# Patient Record
Sex: Female | Born: 1945 | Race: White | Hispanic: No | Marital: Single | Smoking: Former smoker
Health system: Southern US, Community
[De-identification: ages and names within clinical notes are randomized; demographics above are authoritative.]

## PROBLEM LIST (undated history)

## (undated) DIAGNOSIS — J449 Chronic obstructive pulmonary disease, unspecified: Secondary | ICD-10-CM

## (undated) DIAGNOSIS — F329 Major depressive disorder, single episode, unspecified: Secondary | ICD-10-CM

## (undated) DIAGNOSIS — Z9989 Dependence on other enabling machines and devices: Secondary | ICD-10-CM

## (undated) DIAGNOSIS — I10 Essential (primary) hypertension: Secondary | ICD-10-CM

## (undated) DIAGNOSIS — M1990 Unspecified osteoarthritis, unspecified site: Secondary | ICD-10-CM

## (undated) DIAGNOSIS — F419 Anxiety disorder, unspecified: Secondary | ICD-10-CM

## (undated) DIAGNOSIS — G4733 Obstructive sleep apnea (adult) (pediatric): Secondary | ICD-10-CM

## (undated) DIAGNOSIS — F32A Depression, unspecified: Secondary | ICD-10-CM

## (undated) DIAGNOSIS — E119 Type 2 diabetes mellitus without complications: Secondary | ICD-10-CM

## (undated) DIAGNOSIS — E785 Hyperlipidemia, unspecified: Secondary | ICD-10-CM

## (undated) DIAGNOSIS — K5790 Diverticulosis of intestine, part unspecified, without perforation or abscess without bleeding: Secondary | ICD-10-CM

## (undated) HISTORY — PX: COLON SURGERY: SHX602

---

## 2006-03-28 ENCOUNTER — Encounter: Admission: RE | Admit: 2006-03-28 | Discharge: 2006-03-28 | Payer: Self-pay | Admitting: General Practice

## 2013-05-31 ENCOUNTER — Encounter (HOSPITAL_COMMUNITY): Payer: Self-pay | Admitting: Anesthesiology

## 2013-05-31 ENCOUNTER — Encounter (HOSPITAL_COMMUNITY)
Admission: AD | Disposition: A | Discharge status: Other Acute Inpatient Hospital | Payer: Self-pay | Source: Other Acute Inpatient Hospital | Attending: Pulmonary Disease

## 2013-05-31 ENCOUNTER — Inpatient Hospital Stay (HOSPITAL_COMMUNITY): Payer: Medicare Other | Admitting: Anesthesiology

## 2013-05-31 ENCOUNTER — Inpatient Hospital Stay (HOSPITAL_COMMUNITY)
Admission: AD | Admit: 2013-05-31 | Discharge: 2013-06-07 | DRG: 329 | Disposition: A | Discharge status: Other Acute Inpatient Hospital | Payer: Medicare Other | Source: Other Acute Inpatient Hospital | Attending: Pulmonary Disease | Admitting: Pulmonary Disease

## 2013-05-31 DIAGNOSIS — K5732 Diverticulitis of large intestine without perforation or abscess without bleeding: Principal | ICD-10-CM | POA: Diagnosis present

## 2013-05-31 DIAGNOSIS — K631 Perforation of intestine (nontraumatic): Secondary | ICD-10-CM | POA: Diagnosis present

## 2013-05-31 DIAGNOSIS — J189 Pneumonia, unspecified organism: Secondary | ICD-10-CM | POA: Diagnosis not present

## 2013-05-31 DIAGNOSIS — J9600 Acute respiratory failure, unspecified whether with hypoxia or hypercapnia: Secondary | ICD-10-CM | POA: Diagnosis present

## 2013-05-31 DIAGNOSIS — E876 Hypokalemia: Secondary | ICD-10-CM | POA: Diagnosis not present

## 2013-05-31 DIAGNOSIS — Z794 Long term (current) use of insulin: Secondary | ICD-10-CM

## 2013-05-31 DIAGNOSIS — K668 Other specified disorders of peritoneum: Secondary | ICD-10-CM

## 2013-05-31 DIAGNOSIS — E039 Hypothyroidism, unspecified: Secondary | ICD-10-CM | POA: Diagnosis present

## 2013-05-31 DIAGNOSIS — J0410 Acute tracheitis without obstruction: Secondary | ICD-10-CM | POA: Diagnosis not present

## 2013-05-31 DIAGNOSIS — E119 Type 2 diabetes mellitus without complications: Secondary | ICD-10-CM | POA: Diagnosis present

## 2013-05-31 DIAGNOSIS — G4733 Obstructive sleep apnea (adult) (pediatric): Secondary | ICD-10-CM | POA: Diagnosis present

## 2013-05-31 DIAGNOSIS — J4489 Other specified chronic obstructive pulmonary disease: Secondary | ICD-10-CM | POA: Diagnosis present

## 2013-05-31 DIAGNOSIS — D696 Thrombocytopenia, unspecified: Secondary | ICD-10-CM | POA: Diagnosis not present

## 2013-05-31 DIAGNOSIS — M1990 Unspecified osteoarthritis, unspecified site: Secondary | ICD-10-CM | POA: Diagnosis present

## 2013-05-31 DIAGNOSIS — F3289 Other specified depressive episodes: Secondary | ICD-10-CM | POA: Diagnosis present

## 2013-05-31 DIAGNOSIS — Z87891 Personal history of nicotine dependence: Secondary | ICD-10-CM

## 2013-05-31 DIAGNOSIS — L02219 Cutaneous abscess of trunk, unspecified: Secondary | ICD-10-CM | POA: Diagnosis not present

## 2013-05-31 DIAGNOSIS — F329 Major depressive disorder, single episode, unspecified: Secondary | ICD-10-CM | POA: Diagnosis present

## 2013-05-31 DIAGNOSIS — E785 Hyperlipidemia, unspecified: Secondary | ICD-10-CM | POA: Diagnosis present

## 2013-05-31 DIAGNOSIS — K659 Peritonitis, unspecified: Secondary | ICD-10-CM | POA: Diagnosis present

## 2013-05-31 DIAGNOSIS — K55059 Acute (reversible) ischemia of intestine, part and extent unspecified: Secondary | ICD-10-CM | POA: Diagnosis not present

## 2013-05-31 DIAGNOSIS — J449 Chronic obstructive pulmonary disease, unspecified: Secondary | ICD-10-CM | POA: Diagnosis present

## 2013-05-31 DIAGNOSIS — IMO0002 Reserved for concepts with insufficient information to code with codable children: Secondary | ICD-10-CM

## 2013-05-31 DIAGNOSIS — F411 Generalized anxiety disorder: Secondary | ICD-10-CM | POA: Diagnosis present

## 2013-05-31 DIAGNOSIS — J9601 Acute respiratory failure with hypoxia: Secondary | ICD-10-CM

## 2013-05-31 DIAGNOSIS — I10 Essential (primary) hypertension: Secondary | ICD-10-CM | POA: Diagnosis present

## 2013-05-31 HISTORY — DX: Obstructive sleep apnea (adult) (pediatric): G47.33

## 2013-05-31 HISTORY — DX: Essential (primary) hypertension: I10

## 2013-05-31 HISTORY — DX: Dependence on other enabling machines and devices: Z99.89

## 2013-05-31 HISTORY — DX: Anxiety disorder, unspecified: F41.9

## 2013-05-31 HISTORY — PX: LAPAROTOMY: SHX154

## 2013-05-31 HISTORY — DX: Unspecified osteoarthritis, unspecified site: M19.90

## 2013-05-31 HISTORY — DX: Diverticulosis of intestine, part unspecified, without perforation or abscess without bleeding: K57.90

## 2013-05-31 HISTORY — DX: Major depressive disorder, single episode, unspecified: F32.9

## 2013-05-31 HISTORY — DX: Type 2 diabetes mellitus without complications: E11.9

## 2013-05-31 HISTORY — DX: Chronic obstructive pulmonary disease, unspecified: J44.9

## 2013-05-31 HISTORY — PX: COLOSTOMY REVISION: SHX5232

## 2013-05-31 HISTORY — DX: Depression, unspecified: F32.A

## 2013-05-31 HISTORY — DX: Hyperlipidemia, unspecified: E78.5

## 2013-05-31 LAB — PROTIME-INR: INR: 0.93 (ref 0.00–1.49)

## 2013-05-31 SURGERY — LAPAROTOMY, EXPLORATORY
Anesthesia: General | Site: Abdomen | Wound class: Contaminated

## 2013-05-31 MED ORDER — LIDOCAINE HCL (CARDIAC) 20 MG/ML IV SOLN
INTRAVENOUS | Status: DC | PRN
  Administered 2013-05-31: 100 mg via INTRAVENOUS

## 2013-05-31 MED ORDER — ALBUMIN HUMAN 5 % IV SOLN
INTRAVENOUS | Status: DC | PRN
  Administered 2013-05-31: via INTRAVENOUS

## 2013-05-31 MED ORDER — PROPOFOL 10 MG/ML IV BOLUS
INTRAVENOUS | Status: DC | PRN
  Administered 2013-05-31: 150 mg via INTRAVENOUS

## 2013-05-31 MED ORDER — 0.9 % SODIUM CHLORIDE (POUR BTL) OPTIME
TOPICAL | Status: DC | PRN
  Administered 2013-05-31 – 2013-06-01 (×4): 1000 mL

## 2013-05-31 MED ORDER — FENTANYL CITRATE 0.05 MG/ML IJ SOLN
INTRAMUSCULAR | Status: DC | PRN
  Administered 2013-05-31: 100 ug via INTRAVENOUS
  Administered 2013-05-31: 150 ug via INTRAVENOUS
  Administered 2013-05-31 – 2013-06-01 (×2): 100 ug via INTRAVENOUS
  Administered 2013-06-01: 50 ug via INTRAVENOUS

## 2013-05-31 MED ORDER — MIDAZOLAM HCL 5 MG/5ML IJ SOLN
INTRAMUSCULAR | Status: DC | PRN
  Administered 2013-05-31 – 2013-06-01 (×2): 2 mg via INTRAVENOUS

## 2013-05-31 MED ORDER — SUCCINYLCHOLINE CHLORIDE 20 MG/ML IJ SOLN
INTRAMUSCULAR | Status: DC | PRN
  Administered 2013-05-31: 120 mg via INTRAVENOUS

## 2013-05-31 MED ORDER — SODIUM CHLORIDE 0.9 % IV SOLN
1.0000 g | Freq: Once | INTRAVENOUS | Status: AC
  Administered 2013-06-01: 1 g via INTRAVENOUS
  Filled 2013-05-31: qty 1

## 2013-05-31 MED ORDER — ONDANSETRON HCL 4 MG/2ML IJ SOLN
INTRAMUSCULAR | Status: DC | PRN
  Administered 2013-05-31: 4 mg via INTRAVENOUS

## 2013-05-31 MED ORDER — ROCURONIUM BROMIDE 100 MG/10ML IV SOLN
INTRAVENOUS | Status: DC | PRN
  Administered 2013-05-31: 50 mg via INTRAVENOUS
  Administered 2013-06-01: 20 mg via INTRAVENOUS
  Administered 2013-06-01: 10 mg via INTRAVENOUS

## 2013-05-31 MED ORDER — LACTATED RINGERS IV SOLN
INTRAVENOUS | Status: DC | PRN
  Administered 2013-05-31 – 2013-06-01 (×2): via INTRAVENOUS

## 2013-05-31 MED ORDER — HYDROCORTISONE NA SUCCINATE PF 1000 MG IJ SOLR
INTRAMUSCULAR | Status: DC | PRN
  Administered 2013-05-31: 100 mg via INTRAVENOUS

## 2013-05-31 SURGICAL SUPPLY — 53 items
BANDAGE GAUZE ELAST BULKY 4 IN (GAUZE/BANDAGES/DRESSINGS) ×2 IMPLANT
CANISTER SUCTION 2500CC (MISCELLANEOUS) ×4 IMPLANT
CHLORAPREP W/TINT 26ML (MISCELLANEOUS) ×2 IMPLANT
CLOTH BEACON ORANGE TIMEOUT ST (SAFETY) ×2 IMPLANT
COVER SURGICAL LIGHT HANDLE (MISCELLANEOUS) ×4 IMPLANT
DRAPE LAPAROSCOPIC ABDOMINAL (DRAPES) ×2 IMPLANT
DRAPE PROXIMA HALF (DRAPES) ×2 IMPLANT
DRAPE UTILITY 15X26 W/TAPE STR (DRAPE) ×4 IMPLANT
DRAPE WARM FLUID 44X44 (DRAPE) ×2 IMPLANT
DRSG PAD ABDOMINAL 8X10 ST (GAUZE/BANDAGES/DRESSINGS) ×1 IMPLANT
ELECT BLADE 6.5 EXT (BLADE) ×2 IMPLANT
ELECT CAUTERY BLADE 6.4 (BLADE) ×2 IMPLANT
ELECT REM PT RETURN 9FT ADLT (ELECTROSURGICAL) ×2
ELECTRODE REM PT RTRN 9FT ADLT (ELECTROSURGICAL) ×1 IMPLANT
GLOVE BIO SURGEON STRL SZ 6 (GLOVE) ×6 IMPLANT
GLOVE BIO SURGEON STRL SZ7.5 (GLOVE) ×2 IMPLANT
GLOVE BIO SURGEON STRL SZ8 (GLOVE) ×2 IMPLANT
GLOVE BIOGEL PI IND STRL 6.5 (GLOVE) ×1 IMPLANT
GLOVE BIOGEL PI IND STRL 8 (GLOVE) ×1 IMPLANT
GLOVE BIOGEL PI INDICATOR 6.5 (GLOVE) ×1
GLOVE BIOGEL PI INDICATOR 8 (GLOVE) ×1
GLOVE SURG SS PI 7.5 STRL IVOR (GLOVE) ×2 IMPLANT
GOWN PREVENTION PLUS XXLARGE (GOWN DISPOSABLE) ×2 IMPLANT
GOWN STRL NON-REIN LRG LVL3 (GOWN DISPOSABLE) ×4 IMPLANT
GOWN STRL REIN XL XLG (GOWN DISPOSABLE) ×2 IMPLANT
KIT BASIN OR (CUSTOM PROCEDURE TRAY) ×2 IMPLANT
KIT OSTOMY DRAINABLE 2.75 STR (WOUND CARE) ×2 IMPLANT
KIT ROOM TURNOVER OR (KITS) ×2 IMPLANT
NS IRRIG 1000ML POUR BTL (IV SOLUTION) ×4 IMPLANT
PACK GENERAL/GYN (CUSTOM PROCEDURE TRAY) ×2 IMPLANT
PAD ARMBOARD 7.5X6 YLW CONV (MISCELLANEOUS) ×2 IMPLANT
RELOAD PROXIMATE 75MM BLUE (ENDOMECHANICALS) ×2 IMPLANT
SPECIMEN JAR LARGE (MISCELLANEOUS) IMPLANT
SPONGE GAUZE 4X4 12PLY (GAUZE/BANDAGES/DRESSINGS) ×2 IMPLANT
SPONGE LAP 18X18 X RAY DECT (DISPOSABLE) ×6 IMPLANT
STAPLER PROXIMATE 75MM BLUE (STAPLE) ×1 IMPLANT
STAPLER VISISTAT 35W (STAPLE) ×2 IMPLANT
SUCTION POOLE TIP (SUCTIONS) ×2 IMPLANT
SUT PDS AB 1 TP1 96 (SUTURE) ×4 IMPLANT
SUT PDS II 0 TP-1 LOOPED 60 (SUTURE) ×4 IMPLANT
SUT PROLENE 2 0 CT2 30 (SUTURE) ×2 IMPLANT
SUT VIC AB 2-0 SH 18 (SUTURE) ×2 IMPLANT
SUT VIC AB 3-0 SH 18 (SUTURE) ×2 IMPLANT
SUT VIC AB 4-0 RB1 18 (SUTURE) ×4 IMPLANT
SUT VIC AB 4-0 SH 18 (SUTURE) ×1 IMPLANT
SUT VICRYL 4-0 PS2 18IN ABS (SUTURE) IMPLANT
SUT VICRYL AB 2 0 TIES (SUTURE) ×2 IMPLANT
SUT VICRYL AB 3 0 TIES (SUTURE) ×2 IMPLANT
TAPE CLOTH SURG 6X10 WHT LF (GAUZE/BANDAGES/DRESSINGS) ×2 IMPLANT
TOWEL OR 17X24 6PK STRL BLUE (TOWEL DISPOSABLE) ×4 IMPLANT
TOWEL OR 17X26 10 PK STRL BLUE (TOWEL DISPOSABLE) ×2 IMPLANT
TRAY FOLEY CATH 14FRSI W/METER (CATHETERS) ×2 IMPLANT
YANKAUER SUCT BULB TIP NO VENT (SUCTIONS) ×2 IMPLANT

## 2013-05-31 NOTE — Anesthesia Preprocedure Evaluation (Addendum)
Anesthesia Evaluation  Patient identified by MRN, date of birth, ID band Patient awake    Reviewed: Allergy & Precautions, H&P , NPO status , Patient's Chart, lab work & pertinent test results  History of Anesthesia Complications Negative for: history of anesthetic complications  Airway Mallampati: II  Neck ROM: Full  Mouth opening: Limited Mouth Opening  Dental  (+) Dental Advisory Given and Teeth Intact   Pulmonary COPD COPD inhaler,    + wheezing      Cardiovascular hypertension,     Neuro/Psych PSYCHIATRIC DISORDERS Anxiety negative neurological ROS     GI/Hepatic Neg liver ROS,   Endo/Other  diabetes, Insulin DependentMorbid obesity  Renal/GU negative Renal ROS     Musculoskeletal   Abdominal   Peds  Hematology negative hematology ROS (+)   Anesthesia Other Findings   Reproductive/Obstetrics                           Anesthesia Physical Anesthesia Plan  ASA: III and emergent  Anesthesia Plan: General   Post-op Pain Management:    Induction: Intravenous, Rapid sequence and Cricoid pressure planned  Airway Management Planned: Oral ETT  Additional Equipment:   Intra-op Plan:   Post-operative Plan: Post-operative intubation/ventilation  Informed Consent: I have reviewed the patients History and Physical, chart, labs and discussed the procedure including the risks, benefits and alternatives for the proposed anesthesia with the patient or authorized representative who has indicated his/her understanding and acceptance.   Dental advisory given  Plan Discussed with: CRNA, Anesthesiologist and Surgeon  Anesthesia Plan Comments:         Anesthesia Quick Evaluation

## 2013-05-31 NOTE — Anesthesia Procedure Notes (Signed)
Procedure Name: Intubation Date/Time: 05/31/2013 11:15 PM Performed by: Maevyn Riordan S Pre-anesthesia Checklist: Patient identified, Timeout performed, Emergency Drugs available, Suction available and Patient being monitored Patient Re-evaluated:Patient Re-evaluated prior to inductionOxygen Delivery Method: Circle system utilized Preoxygenation: Pre-oxygenation with 100% oxygen Intubation Type: IV induction, Rapid sequence and Cricoid Pressure applied Ventilation: Mask ventilation without difficulty Laryngoscope Size: Mac and 3 Grade View: Grade I Tube type: Subglottic suction tube Tube size: 7.0 mm Number of attempts: 1 Airway Equipment and Method: Stylet Secured at: 23 cm Tube secured with: Tape Dental Injury: Teeth and Oropharynx as per pre-operative assessment

## 2013-05-31 NOTE — Preoperative (Signed)
Beta Blockers   Reason not to administer Beta Blockers:Not Applicable 

## 2013-05-31 NOTE — Consult Note (Signed)
Reason for Consult: perforated diverticulitis Referring Physician: Dr. Nelson Chimes at [facility] is an 67 y.o. female.  HPI:  Pt is a 67 yo F from [place] who contracted community acquired pneumonia after COPD flare started.  She was managed as outpt for 2 weeks with PO steroids and antibiotics.  She then had to go to hospital in [place] where she was given iv steroids, antibiotics, and required bipap.  She states that she had some abdominal pain when she went to the hospital.  She just thought it was "bad gas."  This became acutely worse 2 days ago.  She develops copious flatulance, but no constipation or diarrhea.  She denies fevers or chills.  She had colonoscopy this summer, and she did not have any masses.  She did have diverticuli seen.    PMH End stage COPD OSA DM HTN Hyperlipidemia Hypothyroidism Depression OA Diverticulosis Anxiety  PSH TAH (vertical midline) Lumbar spine fusion  FH Aortic aneurysm in father, mother with breast cancer  Social History:  Previous smoker (quit 6 months ago), lives alone, no EtOH, no drugs  Allergies: Moxifloxicin, spiriva  Medications: solumedrol, morphine, percocet, klonapin, insulin, budesonide, theophylline Atorvastatin, lidocaine patch, pantoprazole, amlodipine, zoloft, levothyroxine, albuterol, zosyn, tramadol, hydralazine, xanax  Review of Systems  Constitutional: Positive for malaise/fatigue.  HENT: Negative.   Eyes: Negative.   Respiratory: Positive for sputum production, shortness of breath and wheezing.   Cardiovascular: Positive for leg swelling.  Gastrointestinal: Positive for nausea and abdominal pain.  Genitourinary: Negative.   Musculoskeletal: Positive for back pain.  Skin: Negative.   Neurological: Negative.   Endo/Heme/Allergies: Bruises/bleeds easily.  Psychiatric/Behavioral: The patient is nervous/anxious.    Temperature 98.6 F (37 C), temperature source Oral, height 5\' 2"  (1.575 m), weight 175  lb 11.3 oz (79.7 kg). Physical Exam  Constitutional: She is oriented to person, place, and time. She appears well-developed and well-nourished. She appears distressed.  HENT:  Head: Normocephalic and atraumatic.  Eyes: Conjunctivae are normal. Pupils are equal, round, and reactive to light. Right eye exhibits no discharge. Left eye exhibits no discharge. No scleral icterus.  Neck: Neck supple. No tracheal deviation present.  Cardiovascular: Normal rate, regular rhythm, normal heart sounds and intact distal pulses.  Exam reveals no gallop and no friction rub.   No murmur heard. Respiratory: She is in respiratory distress (winded with conversation.). She has wheezes. She exhibits no tenderness.  GI: Soft. Bowel sounds are normal. She exhibits distension. There is tenderness. There is rebound and guarding.  Musculoskeletal: She exhibits no edema.  Neurological: She is alert and oriented to person, place, and time. A cranial nerve deficit (hard of hearing) is present. Coordination normal.  Skin: Skin is warm and dry. She is not diaphoretic.  Bruising on abd wall c/w dvt proph injections   Psychiatric:  VERY anxious   CT scan 9/4 Pneumoperitoneum, suggesting bowel perforation, suspect from sigmoid diverticulitis.  Moderate constipation and colonic diverticulosis.  Plt ct 84k, HCT 32.4, Cr. 0.5, K 4.1  Assessment/Plan: Free air - probable perforated diverticulitis IV antibiotics NPO IVF To OR for probable hartmann's procedure. Discussed ostomy, open wound with patient.   Discussed risk of ventilator, trach, death, infection, hernia, leak of colon, ostomy problems, risk of permanent ostomy, risk of prolonged hospitalization.    45 in spent in consultation , >50% in counseling.   Myranda Pavone 05/31/2013, 11:05 PM

## 2013-06-01 ENCOUNTER — Inpatient Hospital Stay (HOSPITAL_COMMUNITY): Payer: Medicare Other

## 2013-06-01 ENCOUNTER — Encounter (HOSPITAL_COMMUNITY): Payer: Self-pay | Admitting: Pulmonary Disease

## 2013-06-01 DIAGNOSIS — J9600 Acute respiratory failure, unspecified whether with hypoxia or hypercapnia: Secondary | ICD-10-CM

## 2013-06-01 DIAGNOSIS — E119 Type 2 diabetes mellitus without complications: Secondary | ICD-10-CM | POA: Diagnosis present

## 2013-06-01 DIAGNOSIS — I10 Essential (primary) hypertension: Secondary | ICD-10-CM

## 2013-06-01 DIAGNOSIS — E039 Hypothyroidism, unspecified: Secondary | ICD-10-CM | POA: Diagnosis present

## 2013-06-01 DIAGNOSIS — J9601 Acute respiratory failure with hypoxia: Secondary | ICD-10-CM | POA: Diagnosis not present

## 2013-06-01 DIAGNOSIS — J449 Chronic obstructive pulmonary disease, unspecified: Secondary | ICD-10-CM | POA: Diagnosis present

## 2013-06-01 DIAGNOSIS — K631 Perforation of intestine (nontraumatic): Secondary | ICD-10-CM | POA: Diagnosis present

## 2013-06-01 DIAGNOSIS — K5732 Diverticulitis of large intestine without perforation or abscess without bleeding: Secondary | ICD-10-CM

## 2013-06-01 LAB — BLOOD GAS, ARTERIAL
MECHVT: 400 mL
PEEP: 5 cmH2O
Patient temperature: 98.5
pH, Arterial: 7.312 — ABNORMAL LOW (ref 7.350–7.450)

## 2013-06-01 LAB — URINE MICROSCOPIC-ADD ON

## 2013-06-01 LAB — COMPREHENSIVE METABOLIC PANEL
ALT: 29 U/L (ref 0–35)
Alkaline Phosphatase: 85 U/L (ref 39–117)
Potassium: 4.2 mEq/L (ref 3.5–5.1)

## 2013-06-01 LAB — BASIC METABOLIC PANEL
Chloride: 104 mEq/L (ref 96–112)
Creatinine, Ser: 0.43 mg/dL — ABNORMAL LOW (ref 0.50–1.10)
GFR calc Af Amer: >90 mL/min (ref >90)
GFR calc non Af Amer: >90 mL/min (ref >90)

## 2013-06-01 LAB — CBC
MCHC: 32 g/dL (ref 30.0–36.0)
MCV: 91 fL (ref 78.0–100.0)
Platelets: 97 10*3/uL — ABNORMAL LOW (ref 150–400)
RDW: 16.9 % — ABNORMAL HIGH (ref 11.5–15.5)

## 2013-06-01 LAB — POCT I-STAT 3, ART BLOOD GAS (G3+)
Acid-Base Excess: 8 mmol/L — ABNORMAL HIGH (ref 0.0–2.0)
Acid-Base Excess: 8 mmol/L — ABNORMAL HIGH (ref 0.0–2.0)
Bicarbonate: 32.4 mEq/L — ABNORMAL HIGH (ref 20.0–24.0)
O2 Saturation: 99 %
Patient temperature: 98.6
TCO2: 34 mmol/L (ref 0–100)
pCO2 arterial: 60.2 mmHg (ref 35.0–45.0)
pO2, Arterial: 108 mmHg — ABNORMAL HIGH (ref 80.0–100.0)
pO2, Arterial: 114 mmHg — ABNORMAL HIGH (ref 80.0–100.0)

## 2013-06-01 LAB — GLUCOSE, CAPILLARY
Glucose-Capillary: 93 mg/dL (ref 70–99)
Glucose-Capillary: 135 mg/dL — ABNORMAL HIGH (ref 70–99)
Glucose-Capillary: 108 mg/dL — ABNORMAL HIGH (ref 70–99)

## 2013-06-01 LAB — URINALYSIS, ROUTINE W REFLEX MICROSCOPIC
Bilirubin Urine: NEGATIVE
Glucose, UA: >1000 mg/dL — AB
Specific Gravity, Urine: 1.031 — ABNORMAL HIGH (ref 1.005–1.030)
pH: 5.5 (ref 5.0–8.0)

## 2013-06-01 MED ORDER — CYCLOSPORINE 0.05 % OP EMUL
1.0000 [drp] | Freq: Two times a day (BID) | OPHTHALMIC | Status: DC
  Administered 2013-06-01 – 2013-06-07 (×11): 1 [drp] via OPHTHALMIC
  Filled 2013-06-01 (×14): qty 1

## 2013-06-01 MED ORDER — OXYCODONE HCL 5 MG PO TABS: 5.0000 mg | ORAL_TABLET | Freq: Once | ORAL | Status: DC | PRN

## 2013-06-01 MED ORDER — METHYLPREDNISOLONE SODIUM SUCC 40 MG IJ SOLR
40.0000 mg | Freq: Every day | INTRAMUSCULAR | Status: DC
  Administered 2013-06-02: 40 mg via INTRAVENOUS
  Filled 2013-06-01 (×2): qty 1

## 2013-06-01 MED ORDER — METHYLPREDNISOLONE SODIUM SUCC 125 MG IJ SOLR
80.0000 mg | Freq: Every day | INTRAMUSCULAR | Status: DC
  Administered 2013-06-01: 80 mg via INTRAVENOUS
  Filled 2013-06-01: qty 1.28

## 2013-06-01 MED ORDER — FENTANYL BOLUS VIA INFUSION
25.0000 ug | Freq: Four times a day (QID) | INTRAVENOUS | Status: DC | PRN
  Filled 2013-06-01: qty 100

## 2013-06-01 MED ORDER — INSULIN ASPART 100 UNIT/ML ~~LOC~~ SOLN
2.0000 [IU] | SUBCUTANEOUS | Status: DC
  Administered 2013-06-01 (×2): 2 [IU] via SUBCUTANEOUS
  Administered 2013-06-01: 4 [IU] via SUBCUTANEOUS
  Administered 2013-06-04 (×2): 2 [IU] via SUBCUTANEOUS
  Administered 2013-06-04: 4 [IU] via SUBCUTANEOUS
  Administered 2013-06-05 (×4): 2 [IU] via SUBCUTANEOUS
  Administered 2013-06-05 – 2013-06-07 (×2): 4 [IU] via SUBCUTANEOUS

## 2013-06-01 MED ORDER — FENTANYL CITRATE 0.05 MG/ML IJ SOLN
50.0000 ug | INTRAMUSCULAR | Status: DC | PRN
  Administered 2013-06-01 – 2013-06-05 (×25): 100 ug via INTRAVENOUS
  Filled 2013-06-01 (×25): qty 2

## 2013-06-01 MED ORDER — VANCOMYCIN HCL 10 G IV SOLR
1500.0000 mg | Freq: Once | INTRAVENOUS | Status: AC
  Administered 2013-06-01: 1500 mg via INTRAVENOUS
  Filled 2013-06-01: qty 1500

## 2013-06-01 MED ORDER — SODIUM CHLORIDE 0.9 % IV SOLN
25.0000 ug/h | INTRAVENOUS | Status: DC
  Administered 2013-06-01: 25 ug/h via INTRAVENOUS
  Filled 2013-06-01: qty 50

## 2013-06-01 MED ORDER — ALBUTEROL SULFATE (5 MG/ML) 0.5% IN NEBU
2.5000 mg | INHALATION_SOLUTION | RESPIRATORY_TRACT | Status: DC
  Administered 2013-06-01 – 2013-06-05 (×19): 2.5 mg via RESPIRATORY_TRACT
  Filled 2013-06-01 (×19): qty 0.5

## 2013-06-01 MED ORDER — VANCOMYCIN HCL IN DEXTROSE 1-5 GM/200ML-% IV SOLN
1000.0000 mg | Freq: Two times a day (BID) | INTRAVENOUS | Status: DC
  Administered 2013-06-02 – 2013-06-03 (×3): 1000 mg via INTRAVENOUS
  Filled 2013-06-01 (×4): qty 200

## 2013-06-01 MED ORDER — CHLORHEXIDINE GLUCONATE 0.12 % MT SOLN
15.0000 mL | Freq: Two times a day (BID) | OROMUCOSAL | Status: DC
  Administered 2013-06-01 – 2013-06-05 (×9): 15 mL via OROMUCOSAL
  Filled 2013-06-01 (×11): qty 15

## 2013-06-01 MED ORDER — LEVOTHYROXINE SODIUM 100 MCG IV SOLR
25.0000 ug | Freq: Every day | INTRAVENOUS | Status: DC
  Administered 2013-06-01 – 2013-06-05 (×4): 25 ug via INTRAVENOUS
  Filled 2013-06-01 (×5): qty 5

## 2013-06-01 MED ORDER — IPRATROPIUM BROMIDE 0.02 % IN SOLN
0.5000 mg | RESPIRATORY_TRACT | Status: DC
  Administered 2013-06-01 – 2013-06-05 (×19): 0.5 mg via RESPIRATORY_TRACT
  Filled 2013-06-01 (×19): qty 2.5

## 2013-06-01 MED ORDER — HYDROMORPHONE HCL PF 1 MG/ML IJ SOLN: 0.2500 mg | INTRAMUSCULAR | Status: DC | PRN

## 2013-06-01 MED ORDER — MIDAZOLAM HCL 2 MG/2ML IJ SOLN
1.0000 mg | INTRAMUSCULAR | Status: DC | PRN
  Administered 2013-06-01: 2 mg via INTRAVENOUS
  Filled 2013-06-01: qty 2

## 2013-06-01 MED ORDER — PANTOPRAZOLE SODIUM 40 MG IV SOLR
40.0000 mg | Freq: Every day | INTRAVENOUS | Status: DC
  Administered 2013-06-01 – 2013-06-05 (×4): 40 mg via INTRAVENOUS
  Filled 2013-06-01 (×5): qty 40

## 2013-06-01 MED ORDER — ALBUTEROL SULFATE HFA 108 (90 BASE) MCG/ACT IN AERS: 4.0000 | INHALATION_SPRAY | RESPIRATORY_TRACT | Status: DC | PRN

## 2013-06-01 MED ORDER — ALBUTEROL SULFATE (5 MG/ML) 0.5% IN NEBU
INHALATION_SOLUTION | RESPIRATORY_TRACT | Status: AC
  Administered 2013-06-01: 2.5 mg
  Filled 2013-06-01: qty 0.5

## 2013-06-01 MED ORDER — FENTANYL CITRATE 0.05 MG/ML IJ SOLN
INTRAMUSCULAR | Status: AC
  Administered 2013-06-01: 100 ug
  Filled 2013-06-01: qty 2

## 2013-06-01 MED ORDER — BIOTENE DRY MOUTH MT LIQD
15.0000 mL | Freq: Four times a day (QID) | OROMUCOSAL | Status: DC
  Administered 2013-06-01 – 2013-06-05 (×16): 15 mL via OROMUCOSAL

## 2013-06-01 MED ORDER — PROMETHAZINE HCL 25 MG/ML IJ SOLN: 6.2500 mg | INTRAMUSCULAR | Status: DC | PRN

## 2013-06-01 MED ORDER — IPRATROPIUM BROMIDE 0.02 % IN SOLN
RESPIRATORY_TRACT | Status: AC
  Administered 2013-06-01: 0.5 mg
  Filled 2013-06-01: qty 2.5

## 2013-06-01 MED ORDER — SODIUM CHLORIDE 0.9 % IV SOLN
500.0000 mg | Freq: Three times a day (TID) | INTRAVENOUS | Status: DC
  Administered 2013-06-01 – 2013-06-07 (×18): 500 mg via INTRAVENOUS
  Filled 2013-06-01 (×23): qty 500

## 2013-06-01 MED ORDER — ALPRAZOLAM 0.25 MG PO TABS
1.0000 mg | ORAL_TABLET | Freq: Three times a day (TID) | ORAL | Status: DC | PRN
  Administered 2013-06-01 – 2013-06-06 (×4): 1 mg via ORAL
  Filled 2013-06-01 (×3): qty 4
  Filled 2013-06-01 (×3): qty 1
  Filled 2013-06-01: qty 4

## 2013-06-01 MED ORDER — OXYCODONE HCL 5 MG/5ML PO SOLN: 5.0000 mg | Freq: Once | ORAL | Status: DC | PRN

## 2013-06-01 MED ORDER — ALBUTEROL SULFATE HFA 108 (90 BASE) MCG/ACT IN AERS
4.0000 | INHALATION_SPRAY | RESPIRATORY_TRACT | Status: DC
  Administered 2013-06-01 (×4): 4 via RESPIRATORY_TRACT
  Filled 2013-06-01: qty 6.7

## 2013-06-01 NOTE — Progress Notes (Signed)
eLink Physician-Brief Progress Note Patient Name: Emylie Amster DOB: 1946/08/31 MRN: [id number]  Date of Service  06/01/2013   HPI/Events of Note  xna x  Home med rprn for anxiety Change d mdi to neb    eICU Interventions     Intervention Category Intermediate Interventions: Other:  Andrew Soria 06/01/2013, 8:18 PM

## 2013-06-01 NOTE — Progress Notes (Signed)
PULMONARY  / CRITICAL CARE MEDICINE  Name: Carol Spencer MRN: [id number] DOB: 06/06/1946    ADMISSION DATE:  05/31/2013 CONSULTATION DATE:  05/31/2013  REFERRING MD :  Donell Beers PRIMARY SERVICE: PCCM  CHIEF COMPLAINT:  Post op vent management  BRIEF PATIENT DESCRIPTION: 67 y/o female with "end stage COPD" was admitted on 9/4 for an emergent Hartmann's procedure for perforated diverticultis.  Post operatively PCCM was asked to assume care.  SIGNIFICANT EVENTS / STUDIES:  8/21 Admitted [place] [facility] COPD exacerbation, mycoplasma pneumonia 8/29 admitted [facility] 9/4 admitted [facility] perforated sigmoid colon 9/5- weaning well  LINES / TUBES: 9/4 ETT >>  CULTURES: 9/5 blood >>  ANTIBIOTICS: 9/5 Ertapenem >>  SUBJECTIVE:  Weaning well today  VITAL SIGNS: Temp:  [98.5 F (36.9 C)-99.2 F (37.3 C)] 99.2 F (37.3 C) (09/05 0847) Pulse Rate:  [80-104] 80 (09/05 0700) Resp:  [13-25] 13 (09/05 0700) BP: (89-165)/(34-76) 89/34 mmHg (09/05 0700) SpO2:  [95 %-100 %] 98 % (09/05 0805) FiO2 (%):  [40 %-100 %] 40 % (09/05 0805) Weight:  [79.7 kg (175 lb 11.3 oz)] 79.7 kg (175 lb 11.3 oz) (09/04 2208) HEMODYNAMICS:   VENTILATOR SETTINGS: Vent Mode:  [-] CPAP;PSV FiO2 (%):  [40 %-100 %] 40 % Set Rate:  [18 bmp] 18 bmp Vt Set:  [400 mL] 400 mL PEEP:  [5 cmH20] 5 cmH20 Pressure Support:  [5 cmH20] 5 cmH20 Plateau Pressure:  [17 cmH20-25 cmH20] 17 cmH20 INTAKE / OUTPUT: Intake/Output     09/04 0701 - 09/05 0700 09/05 0701 - 09/06 0700   I.V. (mL/kg) 1132.5 (14.2)    IV Piggyback 500    Total Intake(mL/kg) 1632.5 (20.5)    Urine (mL/kg/hr) 550    Blood 100    Total Output 650     Net +982.5            PHYSICAL EXAMINATION:  Gen: chronically ill appearing, no acute distress HEENT:PERRL ett wnl PULM: moving air wel this am , cta CV: RRR, no mgr, no JVD AB: No bowel sounds, midline scar, ostomy Ext: warm, trace edema Derm: no rash or skin breakdown Neuro: sedated on vent,  rass -1   LABS:  CBC Recent Labs     05/31/13  2310  WBC  13.2*  HGB  10.4*  HCT  32.5*  PLT  97*   Coag's Recent Labs     05/31/13  2310  APTT  23*  INR  0.93   BMET Recent Labs     05/31/13  2310  NA  138  K  4.2  CL  100  CO2  29  BUN  20  CREATININE  0.59  GLUCOSE  240*   Electrolytes Recent Labs     05/31/13  2310  CALCIUM  8.5   Sepsis Markers No results found for this basename: LACTICACIDVEN, PROCALCITON, O2SATVEN,  in the last 72 hours ABG Recent Labs     06/01/13  0220  06/01/13  0800  PHART  7.312*  7.363  PCO2ART  61.1*  60.2*  PO2ART  339.0*  108.0*   Liver Enzymes Recent Labs     05/31/13  2310  AST  14  ALT  29  ALKPHOS  85  BILITOT  0.4  ALBUMIN  2.7*   Cardiac Enzymes No results found for this basename: TROPONINI, PROBNP,  in the last 72 hours Glucose Recent Labs     06/01/13  0132  06/01/13  0427  06/01/13  0833  GLUCAP  229*  177*  93    Imaging Portable Chest Xray  06/01/2013   *RADIOLOGY REPORT*  Clinical Data: Endotracheal tube placement.  PORTABLE CHEST - 1 VIEW  Comparison: None.  Findings: Endotracheal tube ends in the mid thoracic trachea,  5 cm above the carina.  Gastric suction tube crosses the diaphragm.  Cardiomegaly and diffuse interstitial opacities.  There are retrocardiac and perihilar band-like opacities.  Possible pleural effusions, especially on the left.  No evident pneumothorax.  IMPRESSION: 1. Endotracheal tube ends in the mid thoracic trachea.  Gastric suction tube enters the stomach. 2.  Multifocal atelectasis or pneumonia. 3.  Probable pulmonary edema.   Original Report Authenticated By: Tiburcio Pea     CXR: ett wnl, atx mid lung fields vs infiltrate  ASSESSMENT / PLAN:  PULMONARY A: Acute resp failure End stage COPD > HCAP P:   -wean steroids as no wheezing and concern infiltrate and now wound healing -hold theophylline, would never restart this -scheduled albuterol -check ABG on  cpap5 ps 5, assess rsbi, may require NIMV post extubaiton if chosen -pcxr again am for infiltrate distribution Consider even balance goals Avoid high rates when on rest  CARDIOVASCULAR A: Hypertension P:  -restart home BP meds when able to take PO -tele  RENAL A:  No acute issues P:   -foley -monitor UOP -BMET now post op  GASTROINTESTINAL A:  Perforated sigmoid colon, s/p Hartmann's procedure P:   -NG tube -post op care per surgery  HEMATOLOGIC A:  Relative thrombocytopenia P:  -monitor for bleeding -a HITT was sent at [facility] -avoid heparin for now , lovenox etc -send HITT if unable to follow result at [facility], WIll call  INFECTIOUS A:   HCAP > has received at least 14 days of zosyn by my count Peritonitis P:   -ertapenem for peritonitis -culture sputum -low threshold to change to IMipenem to cover pseudo for lung, residual infiltrates? New?  ENDOCRINE A:  DM2 Hypothyroidism P:   -ICU hyperglycemia protocol -IV synthroid 1/2 home oral dose  NEUROLOGIC A:   Sedation needs on vent Chronic benzo use prior to admission P:   -fentanyl gtt and prn versed titrated to RASS -1 WUA   TODAY'S SUMMARY: weaning to extubatem, change to IMi, infiltrates  I have personally obtained a history, examined the patient, evaluated laboratory and imaging results, formulated the assessment and plan and placed orders. CRITICAL CARE: The patient is critically ill with multiple organ systems failure and requires high complexity decision making for assessment and support, frequent evaluation and titration of therapies, application of advanced monitoring technologies and extensive interpretation of multiple databases. Critical Care Time devoted to patient care services described in this note is 45 minutes.   Rory Percy J.,MD Pulmonary and Critical Care Medicine [facility] Pager: [phone]  06/01/2013, 10:19 AM

## 2013-06-01 NOTE — Consult Note (Signed)
WOC ostomy consult  Stoma type/location:  LLQ colostomy  CCS will manage abdominal surgical wound.  Stomal assessment/size: stoma visualized through 2 1/4 inch pouch.  Pink and moist.  Brown stool noted in pouch.  Peristomal assessment: Unable to visualize.  Output Brown stool noted in pouch.  Ostomy pouching:/2pc.  2 1/4 pouch in place.  Education provided:  Patient was medicated for pain by bedside nurse.  Was extubated less than one hour ago. No respiratory distress noted, oxygen in place. Brother and sister in law in the room.  Gave them the family education booklet and explained that WOC nursing team will be back around to teach ostomy basics and care once patient is more alert. Patient did not know she had a colostomy, explained that she would eliminate stool through the colostomy for now and that WOC nursing team would be providing education on self care.  Patient was very drowsy, information left in the room and will continue to follow up on patient progress and provide teaching. Family members and patient verbalized understanding and willingness to learn.  Patient has 2 pouch systems in the room at this time.  Reorder numbers are Hart [place] #2 (wafer/ barrier) and Hart [place] [phone] (pouch).  WOC nursing team will continue to follow, providing education and reinforcement of self care.  Maple Hudson RN BSN CWON Pager [phone]

## 2013-06-01 NOTE — Progress Notes (Signed)
She is currently reading an oxygen saturation level of 98% on FIO2 of 40% and CP/PS of 5/5.  Looks very comfortable.  It may be possible to extubate the patient, but I would not be quick to move her out of the unit.  She may start to third space more in the next 48 hours.  Marta Lamas. Gae Bon, MD, FACS [phone] [facility]

## 2013-06-01 NOTE — Progress Notes (Signed)
Inpatient Diabetes Program Recommendations  AACE/ADA: New Consensus Statement on Inpatient Glycemic Control (2013)  Target Ranges:  Prepandial:   less than 140 mg/dL      Peak postprandial:   less than 180 mg/dL (1-2 hours)      Critically ill patients:  140 - 180 mg/dL   Reason for Assessment: Hyperglycemia  Inpatient Diabetes Program Recommendations HgbA1C: Please consider Hgb A1C to assess glycemic control prior to admission  Note: Thank you.  Carol Genther S. Elsie Lincoln, RN, CNS, CDE Inpatient Diabetes Program, team pager [phone]

## 2013-06-01 NOTE — Progress Notes (Signed)
ANTIBIOTIC CONSULT NOTE - INITIAL  Pharmacy Consult for imipenem/vancomycin Indication: peritonitis, r/o PNA  Allergies  Allergen Reactions  . Moxifloxacin     unknown  . Spiriva [Tiotropium Bromide Monohydrate]     unknown    Patient Measurements: Height: 5\' 2"  (157.5 cm) Weight: 175 lb 11.3 oz (79.7 kg) IBW/kg (Calculated) : 50.1  Vital Signs: Temp: 98.7 F (37.1 C) (09/05 1208) Temp src: Oral (09/05 1208) BP: 138/57 mmHg (09/05 1000) Pulse Rate: 90 (09/05 1000) Intake/Output from previous day: 09/04 0701 - 09/05 0700 In: 1647.5 [I.V.:1147.5; IV Piggyback:500] Out: 650 [Urine:550; Blood:100] Intake/Output from this shift: Total I/O In: 115 [I.V.:15; IV Piggyback:100] Out: 300 [Urine:300]  Labs:  Recent Labs  05/31/13 2310 06/01/13 1129  WBC 13.2*  --   HGB 10.4*  --   PLT 97*  --   CREATININE 0.59 0.43*   Estimated Creatinine Clearance: 66.7 ml/min (by C-G formula based on Cr of 0.43). No results found for this basename: VANCOTROUGH, VANCOPEAK, VANCORANDOM, GENTTROUGH, GENTPEAK, GENTRANDOM, TOBRATROUGH, TOBRAPEAK, TOBRARND, AMIKACINPEAK, AMIKACINTROU, AMIKACIN,  in the last 72 hours    Medical History: Past Medical History  Diagnosis Date  . COPD (chronic obstructive pulmonary disease)   . OSA on CPAP   . DM2 (diabetes mellitus, type 2)   . HTN (hypertension)   . Hyperlipidemia   . Depression   . Osteoarthritis   . Diverticulosis   . Anxiety     Medications:  Prescriptions prior to admission  Medication Sig Dispense Refill  . acetaminophen (TYLENOL) 325 MG tablet Take 650 mg by mouth every 6 (six) hours as needed for pain or fever.      . ALPRAZolam (XANAX) 1 MG tablet Take 1 mg by mouth every 8 (eight) hours as needed for anxiety.      Marland Kitchen aluminum-magnesium hydroxide-simethicone (MAALOX) 200-200-20 MG/5ML SUSP Take 30 mLs by mouth every 6 (six) hours as needed (heartburn, gas pains).      Marland Kitchen amLODipine (NORVASC) 5 MG tablet Take 5 mg by mouth  daily.      Marland Kitchen atorvastatin (LIPITOR) 80 MG tablet Take 80 mg by mouth daily.      . budesonide (PULMICORT) 0.5 MG/2ML nebulizer solution Take 0.5 mg by nebulization 2 (two) times daily.      . cholecalciferol (VITAMIN D) 1000 UNITS tablet Take 1,000 Units by mouth daily.      . clonazePAM (KLONOPIN) 0.5 MG tablet Take 0.5 mg by mouth every 8 (eight) hours.      . cycloSPORINE (RESTASIS) 0.05 % ophthalmic emulsion Place 1 drop into both eyes 2 (two) times daily.      . fluticasone (FLONASE) 50 MCG/ACT nasal spray Place 1 spray into the nose daily.      Marland Kitchen guaiFENesin (MUCINEX) 600 MG 12 hr tablet Take 600 mg by mouth 2 (two) times daily.      . hydrALAZINE (APRESOLINE) 20 MG/ML injection Inject 10 mg into the vein every 4 (four) hours as needed (SBP> 180 or DBP > 105).      . hydrOXYzine (VISTARIL) 25 MG capsule Take 25 mg by mouth 3 (three) times daily as needed for itching.      . insulin aspart (NOVOLOG) 100 UNIT/ML injection Inject 0-5 Units into the skin 4 (four) times daily -  before meals and at bedtime. If BS 71-150= 0 units, 151-200=1 unit, 201-250=2 units, 251-300=3 units, 301-350=4 units, 351-400=5 units.      . insulin detemir (LEVEMIR) 100 UNIT/ML injection Inject 5 Units  into the skin at bedtime.      Marland Kitchen levothyroxine (SYNTHROID, LEVOTHROID) 50 MCG tablet Take 50 mcg by mouth daily before breakfast.      . lidocaine (LIDODERM) 5 % Place 1 patch onto the skin daily. Remove & Discard patch within 12 hours or as directed by MD      . loratadine (CLARITIN) 10 MG tablet Take 10 mg by mouth daily.      . methylPREDNISolone sodium succinate (SOLU-MEDROL) 125 mg/2 mL injection Inject 60 mg into the vein every 6 (six) hours.      Marland Kitchen morphine 4 MG/ML injection Inject 4 mg into the vein every 4 (four) hours as needed for pain.      Marland Kitchen ondansetron (ZOFRAN) 4 MG/2ML SOLN injection Inject 4 mg into the vein every 4 (four) hours as needed for nausea or vomiting.      Marland Kitchen oxyCODONE-acetaminophen  (PERCOCET/ROXICET) 5-325 MG per tablet Take 1 tablet by mouth every 8 (eight) hours as needed for pain.      Marland Kitchen oxymetazoline (AFRIN) 0.05 % nasal spray Place 2 sprays into the nose 2 (two) times daily.      . pantoprazole (PROTONIX) 40 MG tablet Take 40 mg by mouth daily.      . sertraline (ZOLOFT) 100 MG tablet Take 200 mg by mouth daily.      . Skin Protectants, Misc. (EUCERIN) cream Apply 1 application topically as needed for dry skin.      . sodium chloride (OCEAN) 0.65 % nasal spray Place 1 spray into the nose every 4 (four) hours as needed for congestion.      . THEOPHYLLINE PO Take 150 mg by mouth 2 (two) times daily. Theophylline 150mg /28.169ml liquid      . traMADol (ULTRAM) 50 MG tablet Take 25 mg by mouth every 6 (six) hours as needed for pain.       Scheduled:  . albuterol  4 puff Inhalation Q4H  . antiseptic oral rinse  15 mL Mouth Rinse QID  . chlorhexidine  15 mL Mouth Rinse BID  . cycloSPORINE  1 drop Both Eyes BID  . imipenem-cilastatin  500 mg Intravenous Q8H  . insulin aspart  2-6 Units Subcutaneous Q4H  . levothyroxine  25 mcg Intravenous Daily  . [START ON 06/02/2013] methylPREDNISolone (SOLU-MEDROL) injection  40 mg Intravenous Daily  . pantoprazole (PROTONIX) IV  40 mg Intravenous Daily    Assessment: 67 yo female on ertapenem D1 for peritonitis (s/p emergent Hartmann's procedure on 9/5) for perforated diverticultis to change to imipenem and vancomycin for concern of PNA. Patient was recently at [facility] and was treated with levaquin and zosyn for PNA. Pt then went from [place] to [facility] and was on Zosyn at [facility]. WBC= 13.2, afeb, SCr= 0.59, and CrCl ~65.  9/5 ertapenem>> 9/5 9/5 imipenem>> 9/5 vancomycin  9/5 resp 9/5 blood x2   Goal of Therapy:  Vancomycin trough level 15-20 mcg/ml  Plan:  -Imipenem 500mg  IV q8h and vancomycin 1500mg  IV followed by 1000mg  IV q12h -Will follow renal function, cultures and clinical progress  Harland German, Pharm D  06/01/2013 1:40 PM

## 2013-06-01 NOTE — Progress Notes (Signed)
UR COMPLETED  

## 2013-06-01 NOTE — Transfer of Care (Signed)
Immediate Anesthesia Transfer of Care Note  Patient: Carol Spencer  Procedure(s) Performed: Procedure(s) with comments: EXPLORATORY LAPAROTOMY (N/A) COLON RESECTION SIGMOID (N/A) - with End colostomy  Patient Location: PACU and SICU  Anesthesia Type:General  Level of Consciousness: sedated and Patient remains intubated per anesthesia plan  Airway & Oxygen Therapy: Patient remains intubated per anesthesia plan and Patient placed on Ventilator (see vital sign flow sheet for setting)  Post-op Assessment: Report given to PACU RN and Post -op Vital signs reviewed and stable  Post vital signs: Reviewed and stable  Complications: No apparent anesthesia complications

## 2013-06-01 NOTE — Op Note (Signed)
OPERATIVE REPORT   PREOPERATIVE DIAGNOSIS: Perforated diverticulitis.   POSTOPERATIVE DIAGNOSIS: Perforated diverticulitis.   PROCEDURE PERFORMED:Sigmoid colectomy and end colostomy (hartmann's procedure)  SURGEON: Almond Lint, MD   ASSISTANT: Marca Ancona, MD   ANESTHESIA: General.   FINDINGS: Perforated sigmoid colon   ESTIMATED BLOOD LOSS: 200 mL.   COMPLICATIONS: None known.   PROCEDURE:  Patient was identified in the holding area and taken to  the operating room where she was placed supine on the operating room  table. General anesthesia was induced. The abdomen was prepped and  draped in a sterile fashion. Time-out was performed according to the  surgical safety check list. When all was correct we continued.   The  midline was incised and the subcutaneous tissues were divided with a  Bovie electrocautery. The fascia was opened in the midline.  Minimal purulent fluid was aspirated from the abdomen. The sigmoid colon perforation was immediately visualized, but the site of inflammation was located at the rectosigmoid junction.  The Bookwalter retractor was used to facilitate  visibility. The white line of Toldt of the descending colon was taken down with the Bovie  Electrocautery. This dissection  was carried out proximally along the descending colon. The GIA stapler was used to fire across the sigmoid proximal to the diverticul and the Ligasure was used to divide the mesocolon high up near the colon wall.  The rectum was divided with the GIA 75 proximal to the site of inflammation. The abdomen was then irrigated copiously. The descending colon was mobilized additionally in order to gain more length for the ostomy.    The right upper quadrant and right abdomen was also irrigated.  Attention was directed to the left abdomen for a location for an  ostomy. Kochers were used to pull the fascia to the midline and an  another Zannie Cove was used to elevate the skin to create a  circular site.  A divot of fatty tissue was taken as well. The fascia was opened in a  cruciate fashion separating the longitudinal fibers of the rectus. A  lap was placed behind the fascia to ensure that the Bovie could not go  through and puncture any of the intra-abdominal organs. A Tanja Port was  then advanced through the abdominal wall and used to retract the stump  of the descending colon through the abdominal wall.  The  abdomen was then closed using running #1 looped PDS sutures. The wound  was then irrigated and packed with moist Kerlix. The skin was irrigated and dressed with a wound vac. The colon was then  opened with curved Mayo, and Bovie was used to control bleeding.  4-0 Vicryl sutures were used to create the ostomy. This was then dressed  with a 2 piece wafer. The patient was then taken to the PACU in stable condition.

## 2013-06-01 NOTE — H&P (Signed)
PULMONARY  / CRITICAL CARE MEDICINE  Name: Carol Spencer MRN: [id number] DOB: 1946-01-08    ADMISSION DATE:  05/31/2013 CONSULTATION DATE:  05/31/2013  REFERRING MD :  Donell Beers PRIMARY SERVICE: PCCM  CHIEF COMPLAINT:  Post op vent management  BRIEF PATIENT DESCRIPTION: 67 y/o female with "end stage COPD" was admitted on 9/4 for an emergent Hartmann's procedure for perforated diverticultis.  Post operatively PCCM was asked to assume care.  SIGNIFICANT EVENTS / STUDIES:  8/21 Admitted [facility] COPD exacerbation, mycoplasma pneumonia 8/29 admitted [facility] 9/4 admitted [facility] perforated sigmoid colon  LINES / TUBES: 9/4 ETT >>  CULTURES: 9/5 blood >>  ANTIBIOTICS: 9/5 Ertapenem >>  HISTORY OF PRESENT ILLNESS:  67 y/o female with "end stage COPD" was admitted on 9/4 for an emergent Hartmann's procedure for perforated diverticultis.  Post operatively PCCM was asked to assume care. She was admitted to [facility] on 8/21 for CAP and a COPD exacerbation after failing outpatient therapy.  She underwent a bronchoscopy on 8/22, BAL grew normal flora.  She was noted to be "positive for mycoplasma" (unclear if culture result) and was treated with zosyn and levaquin for 7 days.  She was also treated with high dose solumedrol and theophylline and was discharged to [facility] on both.  A 8/30 [facility] consult note document a solumedrol dose of 80mg  IV q6h with plans for a slow taper. At some point she developed abdominal pain and was sent to [facility] after a CT scan showed peritoneal free air. All history obtained by chart review due to intubation.  PAST MEDICAL HISTORY :  Past Medical History  Diagnosis Date  . COPD (chronic obstructive pulmonary disease)   . OSA on CPAP   . DM2 (diabetes mellitus, type 2)   . HTN (hypertension)   . Hyperlipidemia   . Depression   . Osteoarthritis   . Diverticulosis   . Anxiety    No past surgical history on file. Prior to Admission medications    Solumedrol Morphine prn 7% inhaled saline Percocet prn Clonazepam 0.5 po q8h pulmicort Detemir insulin Theophylline lipitor restasis Afrin prn flonase pantoprazole Loratadine Amlodipine Sertraline Guaifenesin Insulin Levothyroxine duoneb Zosyn Tramadol prn zofran prn   Allergies  Allergen Reactions  . Moxifloxacin   . Spiriva [Tiotropium Bromide Monohydrate]     FAMILY HISTORY:  Family History  Problem Relation Age of Onset  . Breast cancer Mother   . Aortic aneurysm Father    SOCIAL HISTORY:  reports that she quit smoking about 6 months ago. Her smoking use included Cigarettes. She smoked 0.00 packs per day. She does not have any smokeless tobacco history on file. She reports that she does not drink alcohol or use illicit drugs.  REVIEW OF SYSTEMS:  Cannot obtain due to intubation  SUBJECTIVE:   VITAL SIGNS: Temp:  [98.5 F (36.9 C)-98.6 F (37 C)] 98.5 F (36.9 C) (09/05 0138) Pulse Rate:  [104] 104 (09/05 0125) Resp:  [18] 18 (09/05 0125) BP: (165)/(76) 165/76 mmHg (09/05 0125) SpO2:  [100 %] 100 % (09/05 0125) FiO2 (%):  [100 %] 100 % (09/05 0125) Weight:  [79.7 kg (175 lb 11.3 oz)] 79.7 kg (175 lb 11.3 oz) (09/04 2208) HEMODYNAMICS:   VENTILATOR SETTINGS: Vent Mode:  [-] PRVC FiO2 (%):  [100 %] 100 % Set Rate:  [18 bmp] 18 bmp Vt Set:  [400 mL] 400 mL PEEP:  [5 cmH20] 5 cmH20 Plateau Pressure:  [25 cmH20] 25 cmH20 INTAKE / OUTPUT: Intake/Output     09/04  0701 - 09/05 0700   I.V. (mL/kg) 1100 (13.8)   IV Piggyback 500   Total Intake(mL/kg) 1600 (20.1)   Urine (mL/kg/hr) 200   Blood 100   Total Output 300   Net +1300         PHYSICAL EXAMINATION:  Gen: chronically ill appearing, no acute distress HEENT: NCAT, PERRL, EOMi, ETT in place PULM: Poor air movement bilaterally, diminished L base CV: RRR, no mgr, no JVD AB: No bowel sounds, midline scar, ostomy Ext: warm, trace edema, no clubbing, no cyanosis Derm: no rash or skin  breakdown Neuro: sedated on vent   LABS:  CBC Recent Labs     05/31/13  2310  WBC  13.2*  HGB  10.4*  HCT  32.5*  PLT  97*   Coag's Recent Labs     05/31/13  2310  APTT  23*  INR  0.93   BMET Recent Labs     05/31/13  2310  NA  138  K  4.2  CL  100  CO2  29  BUN  20  CREATININE  0.59  GLUCOSE  240*   Electrolytes Recent Labs     05/31/13  2310  CALCIUM  8.5   Sepsis Markers No results found for this basename: LACTICACIDVEN, PROCALCITON, O2SATVEN,  in the last 72 hours ABG No results found for this basename: PHART, PCO2ART, PO2ART,  in the last 72 hours Liver Enzymes Recent Labs     05/31/13  2310  AST  14  ALT  29  ALKPHOS  85  BILITOT  0.4  ALBUMIN  2.7*   Cardiac Enzymes No results found for this basename: TROPONINI, PROBNP,  in the last 72 hours Glucose Recent Labs     06/01/13  0132  GLUCAP  229*    Imaging No results found.   CXR: bilateral airspace disease; left base consolidation, ETT in place  ASSESSMENT / PLAN:  PULMONARY A:  End stage COPD > liberation from the vent will likely be difficult HCAP P:   -wean steroids given lack of wheezing and new wound -hold theophylline, check level -scheduled albuterol -check ABG on current vent settings -extubate to BIPAP prn and QHS  CARDIOVASCULAR A: Hypertension P:  -restart home BP meds when able to take PO -tele  RENAL A:  No acute issues P:   -foley -monitor UOP -BMET in AM  GASTROINTESTINAL A:  Perforated sigmoid colon, s/p Hartmann's procedure P:   -NG tube -post op care per surgery  HEMATOLOGIC A:  No acute issues P:  -monitor for bleeding  INFECTIOUS A:   HCAP > has received at least 14 days of zosyn by my count Peritonitis P:   -ertapenem for peritonitis -hold zosyn -culture sputum  ENDOCRINE A:  DM2 Hypothyroidism P:   -ICU hyperglycemia protocol -IV synthroid 1/2 home oral dose  NEUROLOGIC A:   Sedation needs on vent Chronic benzo use  prior to admission P:   -fentanyl gtt and prn versed titrated to RASS -1  Code status: FULL; no family available; Need to address code status as this will likely be a very difficult post operative course and with her multiple comorbid illnesses the likelihood of a good outcome is low.  TODAY'S SUMMARY:   I have personally obtained a history, examined the patient, evaluated laboratory and imaging results, formulated the assessment and plan and placed orders. CRITICAL CARE: The patient is critically ill with multiple organ systems failure and requires high complexity decision making for  assessment and support, frequent evaluation and titration of therapies, application of advanced monitoring technologies and extensive interpretation of multiple databases. Critical Care Time devoted to patient care services described in this note is 80 minutes.   Fonnie Jarvis Pulmonary and Critical Care Medicine [facility] Pager: [phone]  06/01/2013, 2:14 AM

## 2013-06-01 NOTE — Anesthesia Postprocedure Evaluation (Signed)
  Anesthesia Post-op Note  Patient: Carol Spencer  Procedure(s) Performed: Procedure(s) with comments: EXPLORATORY LAPAROTOMY (N/A) COLON RESECTION SIGMOID (N/A) - with End colostomy  Patient Location: SICU  Anesthesia Type:General  Level of Consciousness: Patient remains intubated per anesthesia plan  Airway and Oxygen Therapy: Patient remains intubated per anesthesia plan and Patient placed on Ventilator (see vital sign flow sheet for setting)  Post-op Pain: none  Post-op Assessment: Post-op Vital signs reviewed, Patient's Cardiovascular Status Stable, Respiratory Function Stable, Patent Airway, No signs of Nausea or vomiting and Pain level controlled  Post-op Vital Signs: Reviewed and stable  Complications: No apparent anesthesia complications

## 2013-06-01 NOTE — Progress Notes (Signed)
Patient ID: Carol Spencer, female   DOB: 13-Mar-1946, 67 y.o.   MRN: [id number] 1 Day Post-Op  Subjective: Pt is awake and alert on the vent, adamantly trying to tell us something. Unclear what it is.  C/o some abdominal pain.   Objective: Vital signs in last 24 hours: Temp:  [98.5 F (36.9 C)-98.6 F (37 C)] 98.6 F (37 C) (09/05 0428) Pulse Rate:  [80-104] 80 (09/05 0700) Resp:  [13-25] 13 (09/05 0700) BP: (89-165)/(34-76) 89/34 mmHg (09/05 0700) SpO2:  [95 %-100 %] 98 % (09/05 0700) FiO2 (%):  [40 %-100 %] 40 % (09/05 0400) Weight:  [175 lb 11.3 oz (79.7 kg)] 175 lb 11.3 oz (79.7 kg) (09/04 2208)    Intake/Output from previous day: 09/04 0701 - 09/05 0700 In: 1632.5 [I.V.:1132.5; IV Piggyback:500] Out: 650 [Urine:550; Blood:100] Intake/Output this shift:    PE: Abd: soft, appropriately tender, diffuse ecchymosis across right side of abdomen.  Wound is open and clean with packing in place.  Ostomy with no air.  Stoma is pink and viable.  NGT with some bilious output  Lab Results:   Recent Labs  05/31/13 2310  WBC 13.2*  HGB 10.4*  HCT 32.5*  PLT 97*   BMET  Recent Labs  05/31/13 2310  NA 138  K 4.2  CL 100  CO2 29  GLUCOSE 240*  BUN 20  CREATININE 0.59  CALCIUM 8.5   PT/INR  Recent Labs  05/31/13 2310  LABPROT 12.3  INR 0.93   CMP     Component Value Date/Time   NA 138 05/31/2013 2310   K 4.2 05/31/2013 2310   CL 100 05/31/2013 2310   CO2 29 05/31/2013 2310   GLUCOSE 240* 05/31/2013 2310   BUN 20 05/31/2013 2310   CREATININE 0.59 05/31/2013 2310   CALCIUM 8.5 05/31/2013 2310   PROT 5.7* 05/31/2013 2310   ALBUMIN 2.7* 05/31/2013 2310   AST 14 05/31/2013 2310   ALT 29 05/31/2013 2310   ALKPHOS 85 05/31/2013 2310   BILITOT 0.4 05/31/2013 2310   GFRNONAA >90 05/31/2013 2310   GFRAA >90 05/31/2013 2310   Lipase  No results found for this basename: lipase       Studies/Results: Portable Chest Xray  06/01/2013   *RADIOLOGY REPORT*  Clinical Data: Endotracheal tube  placement.  PORTABLE CHEST - 1 VIEW  Comparison: None.  Findings: Endotracheal tube ends in the mid thoracic trachea,  5 cm above the carina.  Gastric suction tube crosses the diaphragm.  Cardiomegaly and diffuse interstitial opacities.  There are retrocardiac and perihilar band-like opacities.  Possible pleural effusions, especially on the left.  No evident pneumothorax.  IMPRESSION: 1. Endotracheal tube ends in the mid thoracic trachea.  Gastric suction tube enters the stomach. 2.  Multifocal atelectasis or pneumonia. 3.  Probable pulmonary edema.   Original Report Authenticated By: Tiburcio Pea    Anti-infectives: Anti-infectives   Start     Dose/Rate Route Frequency Ordered Stop   05/31/13 2315  [MAR Hold]  ertapenem (INVANZ) 1 g in sodium chloride 0.9 % 50 mL IVPB     (On MAR Hold since 05/31/13 2307)   1 g 100 mL/hr over 30 Minutes Intravenous  Once 05/31/13 2302 06/01/13 0110       Assessment/Plan  1. Perforated diverticulitis with pneumoperitoneum, s/p Hartman's procedure and open wound Patient Active Problem List   Diagnosis Date Noted  . COPD (chronic obstructive pulmonary disease) 06/01/2013  . Acute respiratory failure with hypoxia 06/01/2013  .  Hypertension 06/01/2013  . Perforated sigmoid colon 06/01/2013  . Hypothyroid 06/01/2013  . DM2 (diabetes mellitus, type 2) 06/01/2013   Plan: 1. Vent and other medical problems per CCM 2. Start NS WD dressing changes to abdominal wound today, BID 3. WOC, RN consult for ostomy care 4. Cont NGT until bowel function returns. 5. Cont abx therapy    LOS: 1 day    Nijae Doyel E 06/01/2013, 7:40 AM Pager: [phone]

## 2013-06-01 NOTE — Procedures (Signed)
Extubation Procedure Note  Patient Details:   Name: Carol Spencer DOB: 12-Sep-1946 MRN: [id number]   Airway Documentation:     Evaluation  O2 sats: stable throughout Complications: No apparent complications Patient did tolerate procedure well. Bilateral Breath Sounds: Rhonchi Suctioning: Oral;Airway Yes  Toula Moos 06/01/2013, 11:22 AM

## 2013-06-02 ENCOUNTER — Inpatient Hospital Stay (HOSPITAL_COMMUNITY): Payer: Medicare Other

## 2013-06-02 DIAGNOSIS — E119 Type 2 diabetes mellitus without complications: Secondary | ICD-10-CM

## 2013-06-02 DIAGNOSIS — I10 Essential (primary) hypertension: Secondary | ICD-10-CM

## 2013-06-02 DIAGNOSIS — J9600 Acute respiratory failure, unspecified whether with hypoxia or hypercapnia: Secondary | ICD-10-CM

## 2013-06-02 DIAGNOSIS — J449 Chronic obstructive pulmonary disease, unspecified: Secondary | ICD-10-CM

## 2013-06-02 LAB — CBC WITH DIFFERENTIAL/PLATELET
Basophils Absolute: 0 10*3/uL (ref 0.0–0.1)
Eosinophils Absolute: 0.1 10*3/uL (ref 0.0–0.7)
Eosinophils Relative: 1 % (ref 0–5)
MCH: 29.5 pg (ref 26.0–34.0)
MCV: 90.5 fL (ref 78.0–100.0)
Neutrophils Relative %: 85 % — ABNORMAL HIGH (ref 43–77)
Platelets: 95 10*3/uL — ABNORMAL LOW (ref 150–400)
RDW: 16.7 % — ABNORMAL HIGH (ref 11.5–15.5)
WBC: 10.4 10*3/uL (ref 4.0–10.5)

## 2013-06-02 LAB — COMPREHENSIVE METABOLIC PANEL
AST: 16 U/L (ref 0–37)
Albumin: 2.4 g/dL — ABNORMAL LOW (ref 3.5–5.2)
Alkaline Phosphatase: 52 U/L (ref 39–117)
BUN: 16 mg/dL (ref 6–23)
CO2: 28 mEq/L (ref 19–32)
Chloride: 101 mEq/L (ref 96–112)
Creatinine, Ser: 0.41 mg/dL — ABNORMAL LOW (ref 0.50–1.10)
GFR calc non Af Amer: >90 mL/min (ref >90)
Potassium: 3.6 mEq/L (ref 3.5–5.1)
Total Bilirubin: 0.7 mg/dL (ref 0.3–1.2)

## 2013-06-02 LAB — GLUCOSE, CAPILLARY: Glucose-Capillary: 106 mg/dL — ABNORMAL HIGH (ref 70–99)

## 2013-06-02 MED ORDER — FUROSEMIDE 10 MG/ML IJ SOLN
40.0000 mg | Freq: Two times a day (BID) | INTRAMUSCULAR | Status: AC
  Administered 2013-06-02 – 2013-06-04 (×4): 40 mg via INTRAVENOUS
  Filled 2013-06-02 (×4): qty 4

## 2013-06-02 MED ORDER — LORAZEPAM 2 MG/ML IJ SOLN
0.5000 mg | Freq: Two times a day (BID) | INTRAMUSCULAR | Status: DC
  Administered 2013-06-02 – 2013-06-07 (×10): 0.5 mg via INTRAVENOUS
  Filled 2013-06-02 (×11): qty 1

## 2013-06-02 MED ORDER — POTASSIUM CHLORIDE 10 MEQ/100ML IV SOLN
10.0000 meq | INTRAVENOUS | Status: AC
  Administered 2013-06-02 (×2): 10 meq via INTRAVENOUS
  Filled 2013-06-02 (×2): qty 100

## 2013-06-02 NOTE — Progress Notes (Signed)
Agree with A&P of KO,PA. Ostomy will likely survive without a re-do. Would consider pulling ng tomorrow

## 2013-06-02 NOTE — Care Management Note (Signed)
Cm consult for LTACH placement. Pt admitted from [facility]. CM to contact [facility]vera Singh @ [phone] to coordinate transition back to facility when patient medically stable per MD. Will continue to follow.    Roxy Manns Cole [place],RN,MSN [phone]

## 2013-06-02 NOTE — Progress Notes (Signed)
Patient ID: Carol Spencer, female   DOB: 1946/06/15, 67 y.o.   MRN: [id number] 2 Days Post-Op  Subjective: Patient feels ok this morning, some abdominal soreness.  Objective: Vital signs in last 24 hours: Temp:  [98.5 F (36.9 C)-99.3 F (37.4 C)] 98.7 F (37.1 C) (09/06 0740) Pulse Rate:  [86-99] 86 (09/06 0600) Resp:  [16-25] 17 (09/06 0600) BP: (119-153)/(41-100) 119/57 mmHg (09/06 0600) SpO2:  [94 %-99 %] 94 % (09/06 0738) FiO2 (%):  [40 %] 40 % (09/06 0600)    Intake/Output from previous day: 09/05 0701 - 09/06 0700 In: 665 [I.V.:165; IV Piggyback:500] Out: 1880 [Urine:1480; Emesis/NG output:400] Intake/Output this shift:    PE: Abd: soft, appropriately tender, hypoactive BS, ND, midline wound is clean and packed currently.  Stoma is necrotic, however, when digitalized, the inner portion of bowel is still viable, but hyperemic. NGT with some bilious output, only 400cc/24hrs  Lab Results:   Recent Labs  05/31/13 2310 06/02/13 0524  WBC 13.2* 10.4  HGB 10.4* 10.2*  HCT 32.5* 31.3*  PLT 97* 95*   BMET  Recent Labs  06/01/13 1129 06/02/13 0524  NA 142 139  K 3.7 3.6  CL 104 101  CO2 31 28  GLUCOSE 136* 138*  BUN 19 16  CREATININE 0.43* 0.41*  CALCIUM 8.5 8.7   PT/INR  Recent Labs  05/31/13 2310  LABPROT 12.3  INR 0.93   CMP     Component Value Date/Time   NA 139 06/02/2013 0524   K 3.6 06/02/2013 0524   CL 101 06/02/2013 0524   CO2 28 06/02/2013 0524   GLUCOSE 138* 06/02/2013 0524   BUN 16 06/02/2013 0524   CREATININE 0.41* 06/02/2013 0524   CALCIUM 8.7 06/02/2013 0524   PROT 5.2* 06/02/2013 0524   ALBUMIN 2.4* 06/02/2013 0524   AST 16 06/02/2013 0524   ALT 39* 06/02/2013 0524   ALKPHOS 52 06/02/2013 0524   BILITOT 0.7 06/02/2013 0524   GFRNONAA >90 06/02/2013 0524   GFRAA >90 06/02/2013 0524   Lipase  No results found for this basename: lipase       Studies/Results: Portable Chest Xray  06/01/2013   *RADIOLOGY REPORT*  Clinical Data: Endotracheal tube  placement.  PORTABLE CHEST - 1 VIEW  Comparison: None.  Findings: Endotracheal tube ends in the mid thoracic trachea,  5 cm above the carina.  Gastric suction tube crosses the diaphragm.  Cardiomegaly and diffuse interstitial opacities.  There are retrocardiac and perihilar band-like opacities.  Possible pleural effusions, especially on the left.  No evident pneumothorax.  IMPRESSION: 1. Endotracheal tube ends in the mid thoracic trachea.  Gastric suction tube enters the stomach. 2.  Multifocal atelectasis or pneumonia. 3.  Probable pulmonary edema.   Original Report Authenticated By: Tiburcio Pea    Anti-infectives: Anti-infectives   Start     Dose/Rate Route Frequency Ordered Stop   06/02/13 0300  vancomycin (VANCOCIN) IVPB 1000 mg/200 mL premix     1,000 mg 200 mL/hr over 60 Minutes Intravenous Every 12 hours 06/01/13 1341     06/01/13 1500  vancomycin (VANCOCIN) 1,500 mg in sodium chloride 0.9 % 500 mL IVPB     1,500 mg 250 mL/hr over 120 Minutes Intravenous  Once 06/01/13 1341 06/01/13 1706   06/01/13 1130  imipenem-cilastatin (PRIMAXIN) 500 mg in sodium chloride 0.9 % 100 mL IVPB     500 mg 200 mL/hr over 30 Minutes Intravenous Every 8 hours 06/01/13 1029     05/31/13 2315  [  MAR Hold]  ertapenem (INVANZ) 1 g in sodium chloride 0.9 % 50 mL IVPB     (On MAR Hold since 05/31/13 2307)   1 g 100 mL/hr over 30 Minutes Intravenous  Once 05/31/13 2302 06/01/13 0110       Assessment/Plan  1. S/p Hartman's for perforated diverticulitis 2. Necrotic stoma 3. COPD, on chronic steroid use  Plan: 1. Patient needs to mobilize, will have PT evaluate the patient 2. Will closely watch stoma, but suspect this necrotic part will eventually slough off.  No evidence of leak right now, but not much in bag to leak. 3. Cont BID dressing changes to the midline wound. 4. Patient only put out 400cc/ 24hr period from NGT yesterday.  Nothing so far today.  Since she had just a HArtman's we may be able to  clamp her NGT soon and dc.  5. She is not ok for transfer back to [facility] yet.  LOS: 2 days    Marjan Rosman E 06/02/2013, 8:03 AM Pager: [phone]

## 2013-06-02 NOTE — Progress Notes (Signed)
Dr. Dwain Sarna called by RN and made aware of patients dark colored stoma. No new orders received at this time.  Dr states that it will be assessed and addressed on morning rounds. Will continue to closely monitor.

## 2013-06-02 NOTE — Progress Notes (Signed)
PULMONARY  / CRITICAL CARE MEDICINE  Name: Carol Spencer MRN: [id number] DOB: Dec 21, 1945    ADMISSION DATE:  05/31/2013 CONSULTATION DATE:  05/31/2013  REFERRING MD :  Donell Beers PRIMARY SERVICE: PCCM  CHIEF COMPLAINT:  Post op vent management  BRIEF PATIENT DESCRIPTION: 67 y/o female with "end stage COPD" was admitted on 9/4 for an emergent Hartmann's procedure for perforated diverticultis.  Post operatively PCCM was asked to assume care.  SIGNIFICANT EVENTS / STUDIES:  8/21 Admitted [facility] COPD exacerbation, mycoplasma pneumonia 8/29 admitted [facility] 9/4 admitted [facility] perforated sigmoid colon 9/5- weaning well 9/6- BIPAP needed   LINES / TUBES: 9/4 ETT >>  CULTURES: 9/5 blood >>  ANTIBIOTICS: 9/5 Ertapenem >>9/5 9/5 Imipenem>>> 9/5 vanc>>>  SUBJECTIVE:  Extubated, some distress, NIMV  VITAL SIGNS: Temp:  [98.5 F (36.9 C)-99.3 F (37.4 C)] 98.7 F (37.1 C) (09/06 0740) Pulse Rate:  [86-99] 88 (09/06 0900) Resp:  [16-25] 18 (09/06 0900) BP: (109-153)/(41-100) 109/55 mmHg (09/06 0900) SpO2:  [94 %-98 %] 96 % (09/06 0900) FiO2 (%):  [40 %] 40 % (09/06 0600) HEMODYNAMICS:   VENTILATOR SETTINGS: Vent Mode:  [-]  FiO2 (%):  [40 %] 40 % INTAKE / OUTPUT: Intake/Output     09/05 0701 - 09/06 0700 09/06 0701 - 09/07 0700   I.V. (mL/kg) 185 (2.3) 40 (0.5)   IV Piggyback 500    Total Intake(mL/kg) 685 (8.6) 40 (0.5)   Urine (mL/kg/hr) 1480 (0.8) 125 (0.4)   Emesis/NG output 400 (0.2)    Blood     Total Output 1880 125   Net -1195 -85          PHYSICAL EXAMINATION:  Gen: chronically ill appearing, mild acute distress HEENT:PERRL ett wnl PULM: Mild coarse, distant CV: RRR, no mgr, no JVD AB: No bowel sounds, midline scar, ostomy Ext: warm,some edema , mild increase Derm: no rash or skin breakdown Neuro: awake, alert   LABS:  CBC Recent Labs     05/31/13  2310  06/02/13  0524  WBC  13.2*  10.4  HGB  10.4*  10.2*  HCT  32.5*  31.3*  PLT  97*   95*   Coag's Recent Labs     05/31/13  2310  APTT  23*  INR  0.93   BMET Recent Labs     05/31/13  2310  06/01/13  1129  06/02/13  0524  NA  138  142  139  K  4.2  3.7  3.6  CL  100  104  101  CO2  29  31  28   BUN  20  19  16   CREATININE  0.59  0.43*  0.41*  GLUCOSE  240*  136*  138*   Electrolytes Recent Labs     05/31/13  2310  06/01/13  1129  06/02/13  0524  CALCIUM  8.5  8.5  8.7   Sepsis Markers No results found for this basename: LACTICACIDVEN, PROCALCITON, O2SATVEN,  in the last 72 hours ABG Recent Labs     06/01/13  0220  06/01/13  0800  06/01/13  1034  PHART  7.312*  7.363  7.453*  PCO2ART  61.1*  60.2*  46.4*  PO2ART  339.0*  108.0*  114.0*   Liver Enzymes Recent Labs     05/31/13  2310  06/02/13  0524  AST  14  16  ALT  29  39*  ALKPHOS  85  52  BILITOT  0.4  0.7  ALBUMIN  2.7*  2.4*   Cardiac Enzymes No results found for this basename: TROPONINI, PROBNP,  in the last 72 hours Glucose Recent Labs     06/01/13  1146  06/01/13  1540  06/01/13  1939  06/02/13  0015  06/02/13  0346  06/02/13  0720  GLUCAP  135*  149*  108*  106*  111*  109*    Imaging Dg Chest Port 1 View  06/02/2013   *RADIOLOGY REPORT*  Clinical Data: Assess infiltrate.  PORTABLE CHEST - 1 VIEW  Comparison: 06/01/2013  Findings: Enteric tube courses into the region of the stomach and off the inferior portion of the film.  Interval removal of an endotracheal tube.  Lungs are hypoinflated with continued left base opacification suggesting effusions/atelectasis.  Interval improvement to near clearing of airspace opacification over the right mid to upper lung.  Mild stable cardiomegaly. Remainder of the exam is unchanged.  IMPRESSION: Improved to nearly resolved airspace density of the right mid to upper lung.  Persistent left base opacification likely atelectasis / effusion although cannot exclude infection.  Stable cardiomegaly.  Enteric tube coursing into the region of the  stomach as tip is not visualized.   Original Report Authenticated By: Elberta Fortis, M.D.   Portable Chest Xray  06/01/2013   *RADIOLOGY REPORT*  Clinical Data: Endotracheal tube placement.  PORTABLE CHEST - 1 VIEW  Comparison: None.  Findings: Endotracheal tube ends in the mid thoracic trachea,  5 cm above the carina.  Gastric suction tube crosses the diaphragm.  Cardiomegaly and diffuse interstitial opacities.  There are retrocardiac and perihilar band-like opacities.  Possible pleural effusions, especially on the left.  No evident pneumothorax.  IMPRESSION: 1. Endotracheal tube ends in the mid thoracic trachea.  Gastric suction tube enters the stomach. 2.  Multifocal atelectasis or pneumonia. 3.  Probable pulmonary edema.   Original Report Authenticated By: Tiburcio Pea   CXR: left effusion, impr5oved rt infiltrate, no ett  ASSESSMENT / PLAN:  PULMONARY A: Acute resp failure End stage COPD HCAP Left effusion P:   -maintain steroids -hold theophylline, would never restart this -scheduled albuterol -pcxr to follow left effusion further -upright psition as able -scheduel NIMV to avoid reintubation, goal 4 hrs on 6 hrs off x 24 hrs -neg balance goals -lasix  CARDIOVASCULAR A: Hypertension P:  -restart home BP meds when able to take PO -tele - k supp  RENAL A:  Effusion, hypokalemia P:   -bmet in am  - k supp -lasix -kvo  GASTROINTESTINAL A:  Perforated sigmoid colon, s/p Hartmann's procedure P:   -NG tube -post op care per surgery  HEMATOLOGIC A:  Relative thrombocytopenia P:  -HITT sent, as confusion from [facility] on there suspcion -Cbc in am   INFECTIOUS A:   HCAP > has received at least 14 days of zosyn by my count Peritonitis P:   -continue vanc, Imipenem  -follow cultures, will narrow in am   ENDOCRINE A:  DM2 Hypothyroidism P:   -ICU hyperglycemia protocol -IV synthroid 1/2 home oral dose  NEUROLOGIC A:   Sedation needs on vent Chronic benzo  use prior to admission P:   -fentanyl  TODAY'S SUMMARY: NIMV stay in icu, lasix  Ccm time 30 min    FEINSTEIN,DANIEL J.,MD Pulmonary and Critical Care Medicine [facility] Pager: [phone]  06/02/2013, 10:52 AM

## 2013-06-03 ENCOUNTER — Inpatient Hospital Stay (HOSPITAL_COMMUNITY): Payer: Medicare Other

## 2013-06-03 DIAGNOSIS — E039 Hypothyroidism, unspecified: Secondary | ICD-10-CM

## 2013-06-03 LAB — CBC WITH DIFFERENTIAL/PLATELET
Eosinophils Absolute: 0.1 10*3/uL (ref 0.0–0.7)
Eosinophils Relative: 1 % (ref 0–5)
Hemoglobin: 9.6 g/dL — ABNORMAL LOW (ref 12.0–15.0)
Lymphocytes Relative: 12 % (ref 12–46)
Lymphs Abs: 1.2 10*3/uL (ref 0.7–4.0)
MCH: 29.6 pg (ref 26.0–34.0)
MCV: 89.8 fL (ref 78.0–100.0)
Monocytes Relative: 4 % (ref 3–12)
Neutrophils Relative %: 83 % — ABNORMAL HIGH (ref 43–77)
RBC: 3.24 MIL/uL — ABNORMAL LOW (ref 3.87–5.11)

## 2013-06-03 LAB — BLOOD GAS, ARTERIAL
Delivery systems: POSITIVE
Drawn by: 36496
Expiratory PAP: 5
Inspiratory PAP: 10
O2 Saturation: 99.6 %
PEEP: 5 cmH2O
pO2, Arterial: 114 mmHg — ABNORMAL HIGH (ref 80.0–100.0)

## 2013-06-03 LAB — BASIC METABOLIC PANEL
BUN: 14 mg/dL (ref 6–23)
Calcium: 8.7 mg/dL (ref 8.4–10.5)
Creatinine, Ser: 0.43 mg/dL — ABNORMAL LOW (ref 0.50–1.10)
GFR calc Af Amer: >90 mL/min (ref >90)
GFR calc non Af Amer: >90 mL/min (ref >90)
Glucose, Bld: 131 mg/dL — ABNORMAL HIGH (ref 70–99)
Potassium: 3.1 mEq/L — ABNORMAL LOW (ref 3.5–5.1)

## 2013-06-03 LAB — GLUCOSE, CAPILLARY
Glucose-Capillary: 149 mg/dL — ABNORMAL HIGH (ref 70–99)
Glucose-Capillary: 103 mg/dL — ABNORMAL HIGH (ref 70–99)

## 2013-06-03 MED ORDER — DEXTROSE-NACL 5-0.45 % IV SOLN
INTRAVENOUS | Status: DC
  Administered 2013-06-03: 1000 mL via INTRAVENOUS
  Administered 2013-06-04: 06:00:00 via INTRAVENOUS
  Administered 2013-06-04: 50 mL/h via INTRAVENOUS
  Administered 2013-06-05 (×2): via INTRAVENOUS
  Administered 2013-06-07: 50 mL/h via INTRAVENOUS

## 2013-06-03 MED ORDER — POTASSIUM CHLORIDE 10 MEQ/100ML IV SOLN
10.0000 meq | INTRAVENOUS | Status: AC
  Administered 2013-06-03 (×4): 10 meq via INTRAVENOUS
  Filled 2013-06-03: qty 100

## 2013-06-03 MED ORDER — POTASSIUM CHLORIDE 10 MEQ/100ML IV SOLN
INTRAVENOUS | Status: AC
  Administered 2013-06-03: 10 meq via INTRAVENOUS
  Filled 2013-06-03: qty 100

## 2013-06-03 MED ORDER — POTASSIUM CHLORIDE 10 MEQ/100ML IV SOLN
10.0000 meq | INTRAVENOUS | Status: AC
  Filled 2013-06-03 (×2): qty 100

## 2013-06-03 MED ORDER — METHYLPREDNISOLONE SODIUM SUCC 40 MG IJ SOLR
20.0000 mg | Freq: Every day | INTRAMUSCULAR | Status: DC
  Administered 2013-06-05 – 2013-06-07 (×3): 20 mg via INTRAVENOUS
  Filled 2013-06-03 (×4): qty 0.5

## 2013-06-03 NOTE — Evaluation (Signed)
Physical Therapy Evaluation Patient Details Name: Carol Spencer MRN: [id number] DOB: 07/01/1946 Today's Date: 06/03/2013 Time: 1410-1435 PT Time Calculation (min): 25 min  PT Assessment / Plan / Recommendation History of Present Illness  1. S/p Hartman's for perforated diverticulitis  2. Cellulitis of abdomen, around her stoma  3. Necrotic stoma  4. End stage COPD on steroids   Clinical Impression  Pt presents with moderate limitations to mobility exacerbated by pain around surgical site.  Recommend return to [facility] for rehab and will benefit from acute PT until d/c or goals met.    PT Assessment  Patient needs continued PT services    Follow Up Recommendations  LTACH    Does the patient have the potential to tolerate intense rehabilitation      Barriers to Discharge Decreased caregiver support      Equipment Recommendations  None recommended by PT    Recommendations for Other Services     Frequency Min 3X/week    Precautions / Restrictions Precautions Precautions: Fall (NG tube, new colostomy)   Pertinent Vitals/Pain Unrated, around colostomy, winces and holds area       Mobility  Bed Mobility Bed Mobility: Left Sidelying to Sit;Sit to Sidelying Left Left Sidelying to Sit: 4: Min assist;HOB elevated;With rails Sit to Sidelying Left: 4: Min assist;HOB flat;With rail Details for Bed Mobility Assistance: verbal instructional cues for technique to minimize abdominal pain  Transfers Transfers: Not assessed (pt declined standing, has been up all morning) Ambulation/Gait Ambulation/Gait Assistance: Not tested (comment) Stairs: No Wheelchair Mobility Wheelchair Mobility: No    Exercises     PT Diagnosis: Generalized weakness;Acute pain  PT Problem List: Pain;Decreased knowledge of precautions;Decreased knowledge of use of DME;Decreased mobility;Decreased activity tolerance PT Treatment Interventions: Patient/family education;Therapeutic activities;Functional  mobility training;Gait training;DME instruction     PT Goals(Current goals can be found in the care plan section) Acute Rehab PT Goals Patient Stated Goal: be able to go home to [place] eventually PT Goal Formulation: With patient Time For Goal Achievement: 06/17/13 Potential to Achieve Goals: Good  Visit Information  Last PT Received On: 06/03/13 Assistance Needed: +1       Prior Functioning  Home Living Family/patient expects to be discharged to::  (back to [facility]) Prior Function Level of Independence: Needs assistance Comments: was at [facility] previous to admission    Cognition  Cognition Arousal/Alertness: Awake/alert Behavior During Therapy: WFL for tasks assessed/performed Overall Cognitive Status: Within Functional Limits for tasks assessed    Extremity/Trunk Assessment Upper Extremity Assessment Upper Extremity Assessment: Defer to OT evaluation Lower Extremity Assessment Lower Extremity Assessment: Overall WFL for tasks assessed   Balance    End of Session PT - End of Session Activity Tolerance: Patient limited by fatigue;Patient limited by pain Patient left: in bed;with call bell/phone within reach;with bed alarm set Nurse Communication: Mobility status  GP     Dennis Bast 06/03/2013, 3:15 PM

## 2013-06-03 NOTE — Progress Notes (Signed)
Agree with A&P of KO,PA  Her abd is tender but soft and I don't think she has peritoneal signs. Her ostomy is black on the top, but I think has some viable colon just inside. This may need revision, but I don't think it has declared itself yet.  Hypokalemic this am so will leave NG in till this is repleted

## 2013-06-03 NOTE — Progress Notes (Signed)
Patient ID: Carol Spencer, female   DOB: Aug 12, 1946, 67 y.o.   MRN: [id number] 3 Days Post-Op  Subjective: Pt c/o abdominal pain today.  Otherwise sitting up in her chair getting a breathing treatment  Objective: Vital signs in last 24 hours: Temp:  [97.8 F (36.6 C)-100.9 F (38.3 C)] 98.3 F (36.8 C) (09/07 0728) Pulse Rate:  [81-115] 83 (09/07 0800) Resp:  [14-26] 18 (09/07 0600) BP: (89-150)/(48-87) 113/57 mmHg (09/07 0800) SpO2:  [94 %-99 %] 97 % (09/07 1140) FiO2 (%):  [40 %] 40 % (09/07 0400)    Intake/Output from previous day: 09/06 0701 - 09/07 0700 In: 1320 [I.V.:420; IV Piggyback:900] Out: 4745 [Urine:4345; Emesis/NG output:400] Intake/Output this shift: Total I/O In: 20 [I.V.:20] Out: 100 [Urine:100]  PE: Abd: soft, tender, hypoactive BS, wound is clean and packed, but subcut. Tissue is becoming more pale.  Ostomy is necrotic and some of the stoma is starting to slough off.  Test tube test done, unsure if I can see viable bowel.  Cellulitis around her stoma and tracking out laterally towards her flank.  Lab Results:   Recent Labs  06/02/13 0524 06/03/13 0508  WBC 10.4 9.8  HGB 10.2* 9.6*  HCT 31.3* 29.1*  PLT 95* 98*   BMET  Recent Labs  06/02/13 0524 06/03/13 0508  NA 139 136  K 3.6 3.1*  CL 101 95*  CO2 28 32  GLUCOSE 138* 131*  BUN 16 14  CREATININE 0.41* 0.43*  CALCIUM 8.7 8.7   PT/INR  Recent Labs  05/31/13 2310  LABPROT 12.3  INR 0.93   CMP     Component Value Date/Time   NA 136 06/03/2013 0508   K 3.1* 06/03/2013 0508   CL 95* 06/03/2013 0508   CO2 32 06/03/2013 0508   GLUCOSE 131* 06/03/2013 0508   BUN 14 06/03/2013 0508   CREATININE 0.43* 06/03/2013 0508   CALCIUM 8.7 06/03/2013 0508   PROT 5.2* 06/02/2013 0524   ALBUMIN 2.4* 06/02/2013 0524   AST 16 06/02/2013 0524   ALT 39* 06/02/2013 0524   ALKPHOS 52 06/02/2013 0524   BILITOT 0.7 06/02/2013 0524   GFRNONAA >90 06/03/2013 0508   GFRAA >90 06/03/2013 0508   Lipase  No results found for this  basename: lipase       Studies/Results: Dg Chest Port 1 View  06/03/2013   *RADIOLOGY REPORT*  Clinical Data: Assess effusion.  PORTABLE CHEST - 1 VIEW  Comparison: 06/01/2013 and 06/02/2013  Findings: The patient is rotated to the left.  Nasogastric tube courses into the region of the stomach and off the film as tip is not seen.  Lungs are somewhat hypoinflated with persistent left base opacification likely small left-sided effusion with atelectasis, although cannot exclude infection.  There is mild worsening of the right perihilar markings suggesting mild interstitial edema and less likely infection.  Remainder of the exam is unchanged.  IMPRESSION: Persistent left base opacification likely a small effusion with atelectasis although cannot exclude infection.  Mild worsening right perihilar markings likely mild interstitial edema and less likely infection.   Original Report Authenticated By: Elberta Fortis, M.D.   Dg Chest Port 1 View  06/02/2013   *RADIOLOGY REPORT*  Clinical Data: Assess infiltrate.  PORTABLE CHEST - 1 VIEW  Comparison: 06/01/2013  Findings: Enteric tube courses into the region of the stomach and off the inferior portion of the film.  Interval removal of an endotracheal tube.  Lungs are hypoinflated with continued left base opacification suggesting effusions/atelectasis.  Interval improvement to near clearing of airspace opacification over the right mid to upper lung.  Mild stable cardiomegaly. Remainder of the exam is unchanged.  IMPRESSION: Improved to nearly resolved airspace density of the right mid to upper lung.  Persistent left base opacification likely atelectasis / effusion although cannot exclude infection.  Stable cardiomegaly.  Enteric tube coursing into the region of the stomach as tip is not visualized.   Original Report Authenticated By: Elberta Fortis, M.D.    Anti-infectives: Anti-infectives   Start     Dose/Rate Route Frequency Ordered Stop   06/02/13 0300  vancomycin  (VANCOCIN) IVPB 1000 mg/200 mL premix  Status:  Discontinued     1,000 mg 200 mL/hr over 60 Minutes Intravenous Every 12 hours 06/01/13 1341 06/03/13 1010   06/01/13 1500  vancomycin (VANCOCIN) 1,500 mg in sodium chloride 0.9 % 500 mL IVPB     1,500 mg 250 mL/hr over 120 Minutes Intravenous  Once 06/01/13 1341 06/01/13 1706   06/01/13 1130  imipenem-cilastatin (PRIMAXIN) 500 mg in sodium chloride 0.9 % 100 mL IVPB     500 mg 200 mL/hr over 30 Minutes Intravenous Every 8 hours 06/01/13 1029     05/31/13 2315  [MAR Hold]  ertapenem (INVANZ) 1 g in sodium chloride 0.9 % 50 mL IVPB     (On MAR Hold since 05/31/13 2307)   1 g 100 mL/hr over 30 Minutes Intravenous  Once 05/31/13 2302 06/01/13 0110       Assessment/Plan  1. S/p Hartman's for perforated diverticulitis 2. Cellulitis of abdomen, around her stoma 3. Necrotic stoma 4. End stage COPD on steroids  Plan: 1. Will need to discuss with MD about stoma and cellulitis.  She is already on abx therapy, but wonder if she may need more gram + coverage for skin. 2. Unclear right now, whether she may need stoma revision or not.  LOS: 3 days    Carol Spencer 06/03/2013, 11:49 AM Pager: [phone]

## 2013-06-03 NOTE — Progress Notes (Signed)
Pt. Unable to wear CPAP at this time due to NG tube. Pt. Is currently on 3 lpm with SPo2 of 94%. RN aware. Will continue to monitor.

## 2013-06-03 NOTE — Progress Notes (Signed)
PULMONARY  / CRITICAL CARE MEDICINE  Name: Carol Spencer MRN: [id number] DOB: September 23, 1946    ADMISSION DATE:  05/31/2013 CONSULTATION DATE:  05/31/2013  REFERRING MD :  Donell Beers PRIMARY SERVICE: PCCM  CHIEF COMPLAINT:  Post op vent management  BRIEF PATIENT DESCRIPTION: 67 y/o female with "end stage COPD" was admitted on 9/4 for an emergent Hartmann's procedure for perforated diverticultis.  Post operatively PCCM was asked to assume care.  SIGNIFICANT EVENTS / STUDIES:  8/21 Admitted [facility] [facility] COPD exacerbation, mycoplasma pneumonia 8/29 admitted [facility] 9/4 admitted [facility] perforated sigmoid colon 9/5- weaning well 9/6- BIPAP needed  9/7- neg 3 liters, improved resp status  LINES / TUBES: 9/4 ETT >>  CULTURES: 9/5 blood >> 9/5 sputum>>>gram neg rod>>>  ANTIBIOTICS: 9/5 Ertapenem >>9/5 9/5 Imipenem>>> 9/5 vanc>>>9/7  SUBJECTIVE: no distress, off nimv  VITAL SIGNS: Temp:  [97.8 F (36.6 C)-100.9 F (38.3 C)] 98.3 F (36.8 C) (09/07 0728) Pulse Rate:  [81-115] 83 (09/07 0800) Resp:  [14-27] 18 (09/07 0600) BP: (89-150)/(48-87) 113/57 mmHg (09/07 0800) SpO2:  [94 %-99 %] 96 % (09/07 0800) FiO2 (%):  [40 %] 40 % (09/07 0400) HEMODYNAMICS:   VENTILATOR SETTINGS: Vent Mode:  [-]  FiO2 (%):  [40 %] 40 % INTAKE / OUTPUT: Intake/Output     09/06 0701 - 09/07 0700 09/07 0701 - 09/08 0700   I.V. (mL/kg) 420 (5.3) 20 (0.3)   IV Piggyback 900    Total Intake(mL/kg) 1320 (16.6) 20 (0.3)   Urine (mL/kg/hr) 4345 (2.3) 100 (0.4)   Emesis/NG output 400 (0.2)    Total Output 4745 100   Net -3425 -80          PHYSICAL EXAMINATION:  Gen: chronically ill appearing, no distress in chair HEENT:PERRL ett wnl PULM:CTa bases CV: RRR, no mgr, no JVD AB: No bowel sounds, midline scar, ostomy some necrosis mucosal>? Ext: warm,some edema , mild increase Derm: no rash or skin breakdown Neuro: awake, alert   LABS:  CBC Recent Labs     05/31/13  2310  06/02/13  0524   06/03/13  0508  WBC  13.2*  10.4  9.8  HGB  10.4*  10.2*  9.6*  HCT  32.5*  31.3*  29.1*  PLT  97*  95*  98*   Coag's Recent Labs     05/31/13  2310  APTT  23*  INR  0.93   BMET Recent Labs     06/01/13  1129  06/02/13  0524  06/03/13  0508  NA  142  139  136  K  3.7  3.6  3.1*  CL  104  101  95*  CO2  31  28  32  BUN  19  16  14   CREATININE  0.43*  0.41*  0.43*  GLUCOSE  136*  138*  131*   Electrolytes Recent Labs     06/01/13  1129  06/02/13  0524  06/03/13  0508  CALCIUM  8.5  8.7  8.7   Sepsis Markers No results found for this basename: LACTICACIDVEN, PROCALCITON, O2SATVEN,  in the last 72 hours ABG Recent Labs     06/01/13  0800  06/01/13  1034  06/03/13  0411  PHART  7.363  7.453*  7.419  PCO2ART  60.2*  46.4*  51.7*  PO2ART  108.0*  114.0*  114.0*   Liver Enzymes Recent Labs     05/31/13  2310  06/02/13  0524  AST  14  16  ALT  29  39*  ALKPHOS  85  52  BILITOT  0.4  0.7  ALBUMIN  2.7*  2.4*   Cardiac Enzymes No results found for this basename: TROPONINI, PROBNP,  in the last 72 hours Glucose Recent Labs     06/02/13  1119  06/02/13  1537  06/02/13  1928  06/02/13  2313  06/03/13  0358  06/03/13  0718  GLUCAP  100*  149*  112*  91  119*  101*    Imaging Dg Chest Port 1 View  06/03/2013   *RADIOLOGY REPORT*  Clinical Data: Assess effusion.  PORTABLE CHEST - 1 VIEW  Comparison: 06/01/2013 and 06/02/2013  Findings: The patient is rotated to the left.  Nasogastric tube courses into the region of the stomach and off the film as tip is not seen.  Lungs are somewhat hypoinflated with persistent left base opacification likely small left-sided effusion with atelectasis, although cannot exclude infection.  There is mild worsening of the right perihilar markings suggesting mild interstitial edema and less likely infection.  Remainder of the exam is unchanged.  IMPRESSION: Persistent left base opacification likely a small effusion with atelectasis  although cannot exclude infection.  Mild worsening right perihilar markings likely mild interstitial edema and less likely infection.   Original Report Authenticated By: Elberta Fortis, M.D.   Dg Chest Port 1 View  06/02/2013   *RADIOLOGY REPORT*  Clinical Data: Assess infiltrate.  PORTABLE CHEST - 1 VIEW  Comparison: 06/01/2013  Findings: Enteric tube courses into the region of the stomach and off the inferior portion of the film.  Interval removal of an endotracheal tube.  Lungs are hypoinflated with continued left base opacification suggesting effusions/atelectasis.  Interval improvement to near clearing of airspace opacification over the right mid to upper lung.  Mild stable cardiomegaly. Remainder of the exam is unchanged.  IMPRESSION: Improved to nearly resolved airspace density of the right mid to upper lung.  Persistent left base opacification likely atelectasis / effusion although cannot exclude infection.  Stable cardiomegaly.  Enteric tube coursing into the region of the stomach as tip is not visualized.   Original Report Authenticated By: Elberta Fortis, M.D.   CXR: left effusion,rotation, rt base hazziness, atx  ASSESSMENT / PLAN:  PULMONARY A: Acute resp failure End stage COPD HCAP Left effusion P:   -maintain steroids, reduce -hold theophylline, would never restart this -scheduled albuterol -pcxr to follow atx -upright psition as able -NIMV to prn -neg balance goals succesasful -lasix  CARDIOVASCULAR A: Hypertension P:  -restart home BP meds when able to take PO -tele - k supp to iv -start maintenance fluids until etaing  RENAL A:  Effusion, hypokalemia P:   -bmet in am  - k supp -lasix -kvo  GASTROINTESTINAL A:  Perforated sigmoid colon, s/p Hartmann's procedure P:   -NG tube -post op care per surgery Start maintenance IV  No TPN planned, should avoid  HEMATOLOGIC A:  Relative thrombocytopenia P:  -HITT pending, as confusion from [facility] on there  suspcion -Cbc in am   INFECTIOUS A:   HCAP > has received at least 14 days of zosyn by my count Peritonitis P:   -dc Imipenem, follow gram neg rod sputum -dc vanc  ENDOCRINE A:  DM2 Hypothyroidism P:   -ICU hyperglycemia protocol -IV synthroid 1/2 home oral dose  NEUROLOGIC A:   Sedation needs on vent Chronic benzo use prior to admission P:   -fentanyl prn In chair pt  TODAY'S SUMMARY: NIMV  to prn, move to sdu    [facility] J.,MD Pulmonary and Critical Care Medicine [facility] Pager: [phone]  06/03/2013, 10:07 AM

## 2013-06-03 NOTE — Progress Notes (Signed)
[facility] ADULT ICU REPLACEMENT PROTOCOL FOR AM LAB REPLACEMENT ONLY  The patient does not apply for the [facility] Adult ICU Electrolyte Replacment Protocol based on the criteria listed below:   1. Is GFR >/= 40 ml/min? yes  Patient's GFR today is >90 2. Is urine output >/= 0.5 ml/kg/hr for the last 6 hours? yes Patient's UOP is 3.3 ml/kg/hr 3. Is BUN < 60 mg/dL? yes  Patient's BUN today is 14 4. Abnormal electrolyte(s):K+ 3.1 5. Ordered repletion with:protocol 6. If a panic level lab has been reported, has the CCM MD in charge been notified? yes.   Physician:  Christoper Allegra 06/03/2013 6:32 AM

## 2013-06-03 NOTE — Progress Notes (Signed)
Discuss risks and benefits of PICC insertion.  Pt. Is unsure she wants this procedure.   She wants to think about it.  Will check back with Pt. Later.

## 2013-06-04 ENCOUNTER — Encounter (HOSPITAL_COMMUNITY): Payer: Self-pay | Admitting: Certified Registered"

## 2013-06-04 ENCOUNTER — Inpatient Hospital Stay (HOSPITAL_COMMUNITY): Payer: Medicare Other | Admitting: Anesthesiology

## 2013-06-04 ENCOUNTER — Inpatient Hospital Stay (HOSPITAL_COMMUNITY): Payer: Medicare Other

## 2013-06-04 ENCOUNTER — Encounter (HOSPITAL_COMMUNITY): Payer: Medicare Other | Admitting: Anesthesiology

## 2013-06-04 ENCOUNTER — Encounter (HOSPITAL_COMMUNITY)
Admission: AD | Disposition: A | Discharge status: Other Acute Inpatient Hospital | Payer: Self-pay | Source: Other Acute Inpatient Hospital | Attending: Pulmonary Disease

## 2013-06-04 DIAGNOSIS — IMO0002 Reserved for concepts with insufficient information to code with codable children: Secondary | ICD-10-CM

## 2013-06-04 HISTORY — PX: LAPAROTOMY: SHX154

## 2013-06-04 HISTORY — PX: COLOSTOMY REVISION: SHX5232

## 2013-06-04 HISTORY — PX: PARTIAL COLECTOMY: SHX5273

## 2013-06-04 LAB — GLUCOSE, CAPILLARY
Glucose-Capillary: 125 mg/dL — ABNORMAL HIGH (ref 70–99)
Glucose-Capillary: 101 mg/dL — ABNORMAL HIGH (ref 70–99)
Glucose-Capillary: 117 mg/dL — ABNORMAL HIGH (ref 70–99)
Glucose-Capillary: 127 mg/dL — ABNORMAL HIGH (ref 70–99)
Glucose-Capillary: 142 mg/dL — ABNORMAL HIGH (ref 70–99)

## 2013-06-04 LAB — BASIC METABOLIC PANEL
Chloride: 93 mEq/L — ABNORMAL LOW (ref 96–112)
GFR calc Af Amer: >90 mL/min (ref >90)
GFR calc non Af Amer: >90 mL/min (ref >90)
Glucose, Bld: 103 mg/dL — ABNORMAL HIGH (ref 70–99)
Potassium: 3.5 mEq/L (ref 3.5–5.1)
Sodium: 134 mEq/L — ABNORMAL LOW (ref 135–145)

## 2013-06-04 LAB — CULTURE, RESPIRATORY W GRAM STAIN

## 2013-06-04 LAB — CBC
MCHC: 32.8 g/dL (ref 30.0–36.0)
RDW: 15.6 % — ABNORMAL HIGH (ref 11.5–15.5)
WBC: 10.2 10*3/uL (ref 4.0–10.5)

## 2013-06-04 SURGERY — REVISION, COLOSTOMY
Anesthesia: General | Site: Abdomen | Wound class: Dirty or Infected

## 2013-06-04 MED ORDER — PROPOFOL 10 MG/ML IV BOLUS
INTRAVENOUS | Status: DC | PRN
  Administered 2013-06-04: 100 mg via INTRAVENOUS

## 2013-06-04 MED ORDER — GLYCOPYRROLATE 0.2 MG/ML IJ SOLN
INTRAMUSCULAR | Status: DC | PRN
  Administered 2013-06-04: .5 mg via INTRAVENOUS

## 2013-06-04 MED ORDER — MIDAZOLAM HCL 5 MG/5ML IJ SOLN
INTRAMUSCULAR | Status: DC | PRN
  Administered 2013-06-04: 1 mg via INTRAVENOUS

## 2013-06-04 MED ORDER — NEOSTIGMINE METHYLSULFATE 1 MG/ML IJ SOLN
INTRAMUSCULAR | Status: DC | PRN
  Administered 2013-06-04: 3 mg via INTRAVENOUS

## 2013-06-04 MED ORDER — ROCURONIUM BROMIDE 100 MG/10ML IV SOLN
INTRAVENOUS | Status: DC | PRN
  Administered 2013-06-04: 20 mg via INTRAVENOUS
  Administered 2013-06-04 (×2): 10 mg via INTRAVENOUS

## 2013-06-04 MED ORDER — ONDANSETRON HCL 4 MG/2ML IJ SOLN
4.0000 mg | INTRAMUSCULAR | Status: DC | PRN
Start: 2013-06-04 — End: 2013-06-07
  Administered 2013-06-04 – 2013-06-05 (×4): 4 mg via INTRAVENOUS
  Filled 2013-06-04 (×4): qty 2

## 2013-06-04 MED ORDER — FENTANYL CITRATE 0.05 MG/ML IJ SOLN
INTRAMUSCULAR | Status: DC | PRN
  Administered 2013-06-04: 100 ug via INTRAVENOUS
  Administered 2013-06-04 (×3): 50 ug via INTRAVENOUS

## 2013-06-04 MED ORDER — HYDROMORPHONE HCL PF 1 MG/ML IJ SOLN
0.2500 mg | INTRAMUSCULAR | Status: DC | PRN
  Administered 2013-06-04 (×4): 0.5 mg via INTRAVENOUS

## 2013-06-04 MED ORDER — 0.9 % SODIUM CHLORIDE (POUR BTL) OPTIME
TOPICAL | Status: DC | PRN
  Administered 2013-06-04: 2000 mL

## 2013-06-04 MED ORDER — ONDANSETRON HCL 4 MG/2ML IJ SOLN
INTRAMUSCULAR | Status: DC | PRN
  Administered 2013-06-04: 4 mg via INTRAVENOUS

## 2013-06-04 MED ORDER — HYDROMORPHONE HCL PF 1 MG/ML IJ SOLN
INTRAMUSCULAR | Status: AC
  Filled 2013-06-04: qty 1

## 2013-06-04 MED ORDER — OXYCODONE HCL 5 MG PO TABS: 5.0000 mg | ORAL_TABLET | Freq: Once | ORAL | Status: DC | PRN

## 2013-06-04 MED ORDER — SUCCINYLCHOLINE CHLORIDE 20 MG/ML IJ SOLN
INTRAMUSCULAR | Status: DC | PRN
  Administered 2013-06-04: 80 mg via INTRAVENOUS

## 2013-06-04 MED ORDER — LIDOCAINE HCL (CARDIAC) 20 MG/ML IV SOLN
INTRAVENOUS | Status: DC | PRN
  Administered 2013-06-04: 40 mg via INTRAVENOUS

## 2013-06-04 MED ORDER — LACTATED RINGERS IV SOLN
INTRAVENOUS | Status: DC | PRN
  Administered 2013-06-04 (×2): via INTRAVENOUS

## 2013-06-04 MED ORDER — OXYCODONE HCL 5 MG/5ML PO SOLN: 5.0000 mg | Freq: Once | ORAL | Status: DC | PRN

## 2013-06-04 SURGICAL SUPPLY — 58 items
BANDAGE GAUZE ELAST BULKY 4 IN (GAUZE/BANDAGES/DRESSINGS) ×2 IMPLANT
BLADE SURG ROTATE 9660 (MISCELLANEOUS) IMPLANT
CANISTER SUCTION 2500CC (MISCELLANEOUS) ×3 IMPLANT
CHLORAPREP W/TINT 26ML (MISCELLANEOUS) IMPLANT
CLOTH BEACON ORANGE TIMEOUT ST (SAFETY) ×3 IMPLANT
COVER MAYO STAND STRL (DRAPES) ×3 IMPLANT
COVER SURGICAL LIGHT HANDLE (MISCELLANEOUS) ×3 IMPLANT
DRAPE LAPAROSCOPIC ABDOMINAL (DRAPES) ×3 IMPLANT
DRAPE PROXIMA HALF (DRAPES) IMPLANT
DRAPE UTILITY 15X26 W/TAPE STR (DRAPE) ×9 IMPLANT
DRAPE WARM FLUID 44X44 (DRAPE) ×3 IMPLANT
DRSG PAD ABDOMINAL 8X10 ST (GAUZE/BANDAGES/DRESSINGS) ×4 IMPLANT
ELECT BLADE 6.5 EXT (BLADE) ×3 IMPLANT
ELECT CAUTERY BLADE 6.4 (BLADE) ×3 IMPLANT
ELECT REM PT RETURN 9FT ADLT (ELECTROSURGICAL) ×3
ELECTRODE REM PT RTRN 9FT ADLT (ELECTROSURGICAL) ×2 IMPLANT
GLOVE BIO SURGEON STRL SZ8 (GLOVE) ×3 IMPLANT
GLOVE BIOGEL PI IND STRL 7.0 (GLOVE) ×4 IMPLANT
GLOVE BIOGEL PI IND STRL 8 (GLOVE) ×2 IMPLANT
GLOVE BIOGEL PI INDICATOR 7.0 (GLOVE) ×2
GLOVE BIOGEL PI INDICATOR 8 (GLOVE) ×1
GLOVE SURG SIGNA 7.5 PF LTX (GLOVE) ×6 IMPLANT
GLOVE SURG SS PI 7.0 STRL IVOR (GLOVE) ×4 IMPLANT
GOWN STRL NON-REIN LRG LVL3 (GOWN DISPOSABLE) ×12 IMPLANT
GOWN STRL REIN XL XLG (GOWN DISPOSABLE) ×6 IMPLANT
KIT BASIN OR (CUSTOM PROCEDURE TRAY) ×3 IMPLANT
KIT OSTOMY DRAINABLE 2.75 STR (WOUND CARE) ×3 IMPLANT
KIT ROOM TURNOVER OR (KITS) ×3 IMPLANT
LEGGING LITHOTOMY PAIR STRL (DRAPES) IMPLANT
LIGASURE IMPACT 36 18CM CVD LR (INSTRUMENTS) ×3 IMPLANT
NS IRRIG 1000ML POUR BTL (IV SOLUTION) ×6 IMPLANT
PACK GENERAL/GYN (CUSTOM PROCEDURE TRAY) ×3 IMPLANT
PAD ARMBOARD 7.5X6 YLW CONV (MISCELLANEOUS) ×3 IMPLANT
PAD SHARPS MAGNETIC DISPOSAL (MISCELLANEOUS) ×3 IMPLANT
SPECIMEN JAR MEDIUM (MISCELLANEOUS) IMPLANT
SPONGE GAUZE 4X4 12PLY (GAUZE/BANDAGES/DRESSINGS) ×3 IMPLANT
SPONGE LAP 18X18 X RAY DECT (DISPOSABLE) IMPLANT
STAPLER PROXIMATE 75MM BLUE (STAPLE) ×3 IMPLANT
STAPLER VISISTAT 35W (STAPLE) ×3 IMPLANT
SUCTION POOLE TIP (SUCTIONS) IMPLANT
SURGILUBE 2OZ TUBE FLIPTOP (MISCELLANEOUS) IMPLANT
SUT NOVA 1 T20/GS 25DT (SUTURE) ×3 IMPLANT
SUT PDS AB 1 TP1 96 (SUTURE) ×6 IMPLANT
SUT PROLENE 2 0 CT2 30 (SUTURE) IMPLANT
SUT PROLENE 2 0 KS (SUTURE) IMPLANT
SUT SILK 2 0 SH CR/8 (SUTURE) ×3 IMPLANT
SUT SILK 2 0 TIES 10X30 (SUTURE) ×3 IMPLANT
SUT SILK 3 0 SH CR/8 (SUTURE) ×3 IMPLANT
SUT SILK 3 0 TIES 10X30 (SUTURE) ×3 IMPLANT
SUT VIC AB 3-0 SH 18 (SUTURE) ×2 IMPLANT
SYR BULB IRRIGATION 50ML (SYRINGE) ×3 IMPLANT
TAPE CLOTH SURG 6X10 WHT LF (GAUZE/BANDAGES/DRESSINGS) ×3 IMPLANT
TOWEL OR 17X26 10 PK STRL BLUE (TOWEL DISPOSABLE) ×6 IMPLANT
TRAY FOLEY CATH 14FRSI W/METER (CATHETERS) IMPLANT
TRAY PROCTOSCOPIC FIBER OPTIC (SET/KITS/TRAYS/PACK) IMPLANT
UNDERPAD 30X30 INCONTINENT (UNDERPADS AND DIAPERS) IMPLANT
WATER STERILE IRR 1000ML POUR (IV SOLUTION) IMPLANT
YANKAUER SUCT BULB TIP NO VENT (SUCTIONS) ×6 IMPLANT

## 2013-06-04 NOTE — Consult Note (Signed)
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 Pt not assessed today; discussed plan of care with [facility] team.  They state the stoma has become increasingly necrotic throughout the weekend and skin surrounding on abd with cellulitis.  Plans for possible return to the OR today according to [place], [place]. Will follow-up tomorrow. Cammie Mcgee MSN, RN, CWOCN, [place], CNS [phone]

## 2013-06-04 NOTE — Progress Notes (Signed)
PULMONARY  / CRITICAL CARE MEDICINE  Name: Carol Spencer MRN: [id number] DOB: 1945-10-11    ADMISSION DATE:  05/31/2013 CONSULTATION DATE:  05/31/2013  REFERRING MD :  Donell Beers PRIMARY SERVICE: PCCM  CHIEF COMPLAINT:  Post op vent management  BRIEF PATIENT DESCRIPTION: 67 y/o female with "end stage COPD" was admitted on 9/4 for an emergent Hartmann's procedure for perforated diverticultis.  Post operatively PCCM was asked to assume care.  SIGNIFICANT EVENTS / STUDIES:  8/21 Admitted [facility] COPD exacerbation, mycoplasma pneumonia 8/29 admitted [facility] 9/4 admitted [facility] perforated sigmoid colon 9/5- weaning well 9/6- BIPAP needed  9/7- neg 3 liters, improved resp status 9/8 Necrotic ostomy, will need revision to OR 9/8  LINES / TUBES: 9/4 ETT >>9/5  CULTURES: 9/5 blood >>neg 9/5 sputum>>>gram neg rod>>>mod stenotrophmonas maltophilia  ANTIBIOTICS: 9/5 Ertapenem >>9/5 9/5 Imipenem(stenotrophomonas in trachea)>>> 9/5 vanc>>>9/7  SUBJECTIVE:  Pt states frustrated that has to go to OR today  VITAL SIGNS: Temp:  [98.2 F (36.8 C)-99.4 F (37.4 C)] 98.2 F (36.8 C) (09/08 0700) Pulse Rate:  [84-110] 106 (09/08 0700) Resp:  [16-30] 22 (09/08 0700) BP: (82-136)/(42-111) 108/60 mmHg (09/08 0700) SpO2:  [90 %-99 %] 97 % (09/08 0724) Weight:  [73.6 kg (162 lb 4.1 oz)] 73.6 kg (162 lb 4.1 oz) (09/08 0400)      INTAKE / OUTPUT: Intake/Output     09/07 0701 - 09/08 0700 09/08 0701 - 09/09 0700   I.V. (mL/kg) 950 (12.9)    IV Piggyback 600    Total Intake(mL/kg) 1550 (21.1)    Urine (mL/kg/hr) 3375 (1.9)    Emesis/NG output 75 (0)    Total Output 3450     Net -1900            PHYSICAL EXAMINATION:  Gen: chronically ill appearing, no distress in chair HEENT:PERRL PULM:CTa bases CV: RRR, no mgr, no JVD AB: No bowel sounds, midline scar, ostomy necrotic mucosa Ext: warm,some edema , mild increase Derm: no rash or skin breakdown Neuro: awake,  alert   LABS:  CBC Recent Labs     06/02/13  0524  06/03/13  0508  06/04/13  0850  WBC  10.4  9.8  10.2  HGB  10.2*  9.6*  10.2*  HCT  31.3*  29.1*  31.1*  PLT  95*  98*  128*   Coag's No results found for this basename: APTT, INR,  in the last 72 hours BMET Recent Labs     06/02/13  0524  06/03/13  0508  06/04/13  0520  NA  139  136  134*  K  3.6  3.1*  3.5  CL  101  95*  93*  CO2  28  32  30  BUN  16  14  13   CREATININE  0.41*  0.43*  0.43*  GLUCOSE  138*  131*  103*   Electrolytes Recent Labs     06/02/13  0524  06/03/13  0508  06/04/13  0520  CALCIUM  8.7  8.7  8.9   Sepsis Markers No results found for this basename: LACTICACIDVEN, PROCALCITON, O2SATVEN,  in the last 72 hours ABG Recent Labs     06/01/13  1034  06/03/13  0411  PHART  7.453*  7.419  PCO2ART  46.4*  51.7*  PO2ART  114.0*  114.0*   Liver Enzymes Recent Labs     06/02/13  0524  AST  16  ALT  39*  ALKPHOS  52  BILITOT  0.7  ALBUMIN  2.4*   Cardiac Enzymes No results found for this basename: TROPONINI, PROBNP,  in the last 72 hours Glucose Recent Labs     06/03/13  1114  06/03/13  1547  06/03/13  1956  06/04/13  0110  06/04/13  0430  06/04/13  0730  GLUCAP  103*  107*  110*  125*  101*  117*    Imaging Dg Chest Port 1 View  06/04/2013   *RADIOLOGY REPORT*  Clinical Data: Assess atelectasis  PORTABLE CHEST - 1 VIEW  Comparison: Yesterday  Findings: NG tube stable.  Dense opacity at the left base is stable.  Stable hazy density at the right base.  No pneumothorax.  IMPRESSION: Stable bibasilar opacity left greater than right.   Original Report Authenticated By: Jolaine Click, M.D.   Dg Chest Port 1 View  06/03/2013   *RADIOLOGY REPORT*  Clinical Data: Assess effusion.  PORTABLE CHEST - 1 VIEW  Comparison: 06/01/2013 and 06/02/2013  Findings: The patient is rotated to the left.  Nasogastric tube courses into the region of the stomach and off the film as tip is not seen.  Lungs  are somewhat hypoinflated with persistent left base opacification likely small left-sided effusion with atelectasis, although cannot exclude infection.  There is mild worsening of the right perihilar markings suggesting mild interstitial edema and less likely infection.  Remainder of the exam is unchanged.  IMPRESSION: Persistent left base opacification likely a small effusion with atelectasis although cannot exclude infection.  Mild worsening right perihilar markings likely mild interstitial edema and less likely infection.   Original Report Authenticated By: Elberta Fortis, M.D.   CXR:  9/8 bibasilar ATX  ASSESSMENT / PLAN:  PULMONARY A: Acute resp failure End stage COPD HCAP Left effusion  P:   -maintain steroids -hold theophylline -scheduled albuterol    CARDIOVASCULAR A: Hypertension P:  -restart home BP meds when able to take PO -tele  RENAL A:  Effusion, hypokalemia P:   -bmet in am  -lasix -kvo  GASTROINTESTINAL A:  Perforated sigmoid colon, s/p Hartmann's procedure Now with necrotic ostomy P:   -to return to OR 9/8 -NG tube Start maintenance IV  ??TPN Prob needs a PICC line  HEMATOLOGIC A:  Relative thrombocytopenia improving  P:  monitor  INFECTIOUS A:   stenotrophomonas tracheitis Peritonitis P:   Cont imipemin with cellulitis of abd wall and stenotrophomonas in trachea  ENDOCRINE A:  DM2 Hypothyroidism P:   SSI -cont IV synthroid 1/2 home oral dose  NEUROLOGIC A:   Sedation needs on vent Chronic benzo use prior to admission P:   -fentanyl prn  TODAY'S SUMMARY:  67 y.o.F with Gold D Copd s/p exlap for perf diverticulum. Now has to go to OR for ostomy revision 9/8, prob back to ICU postop.    Adella Hare Beeper  [phone]  Cell  [phone]  If no response or cell goes to voicemail, call beeper [phone]  Pulmonary and Critical Care Medicine [facility] Pager: [phone]  06/04/2013, 9:43 AM

## 2013-06-04 NOTE — Anesthesia Procedure Notes (Signed)
Procedure Name: Intubation Date/Time: 06/04/2013 11:16 AM Performed by: Charm Barges, Nikoleta Dady R Pre-anesthesia Checklist: Emergency Drugs available, Patient identified, Suction available, Patient being monitored and Timeout performed Patient Re-evaluated:Patient Re-evaluated prior to inductionOxygen Delivery Method: Circle system utilized Preoxygenation: Pre-oxygenation with 100% oxygen Intubation Type: IV induction Ventilation: Mask ventilation with difficulty Laryngoscope Size: Mac and 3 Grade View: Grade III Tube type: Oral Tube size: 7.5 mm Number of attempts: 1 Airway Equipment and Method: Stylet Placement Confirmation: ETT inserted through vocal cords under direct vision,  positive ETCO2 and breath sounds checked- equal and bilateral Secured at: 20 cm Tube secured with: Tape Dental Injury: Teeth and Oropharynx as per pre-operative assessment

## 2013-06-04 NOTE — Progress Notes (Signed)
I have seen and examined the patient and agree with the assessment and plans.  Her ostomy is necrotic and the abdominal wall is having worsening cellulitis.  She needs to go back to the OR today for ostomy revision. I discussed this with her in detail.  I discussed the risks which include but are not limited to bleeding, ongoing infection, need for further surgery, injury to surrounding structures, cardiopulmonary problems, etc.  She agrees to proceed.  Roise Emert A. Magnus Ivan  MD, FACS

## 2013-06-04 NOTE — Preoperative (Signed)
Beta Blockers   Reason not to administer Beta Blockers:Not Applicable 

## 2013-06-04 NOTE — Anesthesia Postprocedure Evaluation (Signed)
  Anesthesia Post-op Note  Patient: Carol Spencer  Procedure(s) Performed: Procedure(s): COLOSTOMY REVISION (N/A) EXPLORATORY LAPAROTOMY PARTIAL COLECTOMY  Patient Location: PACU  Anesthesia Type:General  Level of Consciousness: awake  Airway and Oxygen Therapy: Patient Spontanous Breathing and Patient connected to nasal cannula oxygen  Post-op Pain: moderate  Post-op Assessment: Post-op Vital signs reviewed, Patient's Cardiovascular Status Stable, Respiratory Function Stable, Patent Airway and No signs of Nausea or vomiting  Post-op Vital Signs: Reviewed and stable  Complications: No apparent anesthesia complications

## 2013-06-04 NOTE — Progress Notes (Signed)
ANTIBIOTIC CONSULT NOTE - FOLLOW UP  Pharmacy Consult for Primaxin Indication: pneumonia  Allergies  Allergen Reactions  . Moxifloxacin     unknown  . Spiriva [Tiotropium Bromide Monohydrate]     unknown    Patient Measurements: Height: 5\' 2"  (157.5 cm) Weight: 162 lb 4.1 oz (73.6 kg) IBW/kg (Calculated) : 50.1 Adjusted Body Weight:   Vital Signs: Temp: 97.5 F (36.4 C) (09/08 1330) Temp src: Axillary (09/08 0700) BP: 151/71 mmHg (09/08 1345) Pulse Rate: 102 (09/08 1400) Intake/Output from previous day: 09/07 0701 - 09/08 0700 In: 1550 [I.V.:950; IV Piggyback:600] Out: 3450 [Urine:3375; Emesis/NG output:75] Intake/Output from this shift: Total I/O In: 1150 [I.V.:1150] Out: 490 [Urine:290; Emesis/NG output:100; Blood:100]  Labs:  Recent Labs  06/02/13 0524 06/03/13 0508 06/04/13 0520 06/04/13 0850  WBC 10.4 9.8  --  10.2  HGB 10.2* 9.6*  --  10.2*  PLT 95* 98*  --  128*  CREATININE 0.41* 0.43* 0.43*  --    Estimated Creatinine Clearance: 64.1 ml/min (by C-G formula based on Cr of 0.43). No results found for this basename: VANCOTROUGH, Leodis Binet, VANCORANDOM, GENTTROUGH, GENTPEAK, GENTRANDOM, TOBRATROUGH, TOBRAPEAK, TOBRARND, AMIKACINPEAK, AMIKACINTROU, AMIKACIN,  in the last 72 hours   Microbiology: Recent Results (from the past 720 hour(s))  MRSA PCR SCREENING     Status: None   Collection Time    05/31/13 10:10 PM      Result Value Range Status   MRSA by PCR NEGATIVE  NEGATIVE Final   Comment:            The GeneXpert MRSA Assay (FDA     approved for NASAL specimens     only), is one component of a     comprehensive MRSA colonization     surveillance program. It is not     intended to diagnose MRSA     infection nor to guide or     monitor treatment for     MRSA infections.  CULTURE, RESPIRATORY (NON-EXPECTORATED)     Status: None   Collection Time    06/01/13  2:21 AM      Result Value Range Status   Specimen Description TRACHEAL ASPIRATE    Final   Special Requests NONE   Final   Gram Stain     Final   Value: RARE WBC PRESENT,BOTH PMN AND MONONUCLEAR     NO SQUAMOUS EPITHELIAL CELLS SEEN     MODERATE GRAM NEGATIVE RODS     Performed at [facility]   Culture     Final   Value: MODERATE STENOTROPHOMONAS MALTOPHILIA     Performed at [facility]   Report Status 06/04/2013 FINAL   Final   Organism ID, Bacteria STENOTROPHOMONAS MALTOPHILIA   Final  CULTURE, BLOOD (ROUTINE X 2)     Status: None   Collection Time    06/01/13  3:30 AM      Result Value Range Status   Specimen Description BLOOD RIGHT ARM   Final   Special Requests     Final   Value: BOTTLES DRAWN AEROBIC AND ANAEROBIC 10CC BLUE,5CC RED   Culture  Setup Time     Final   Value: 06/01/2013 11:40     Performed at [facility]   Culture     Final   Value:        BLOOD CULTURE RECEIVED NO GROWTH TO DATE CULTURE WILL BE HELD FOR 5 DAYS BEFORE ISSUING A FINAL NEGATIVE REPORT  Performed at [facility]   Report Status PENDING   Incomplete  CULTURE, BLOOD (ROUTINE X 2)     Status: None   Collection Time    06/01/13  3:35 AM      Result Value Range Status   Specimen Description BLOOD RIGHT HAND   Final   Special Requests BOTTLES DRAWN AEROBIC ONLY 10CC   Final   Culture  Setup Time     Final   Value: 06/01/2013 11:40     Performed at [facility]   Culture     Final   Value:        BLOOD CULTURE RECEIVED NO GROWTH TO DATE CULTURE WILL BE HELD FOR 5 DAYS BEFORE ISSUING A FINAL NEGATIVE REPORT     Performed at [facility]   Report Status PENDING   Incomplete    Anti-infectives   Start     Dose/Rate Route Frequency Ordered Stop   06/02/13 0300  vancomycin (VANCOCIN) IVPB 1000 mg/200 mL premix  Status:  Discontinued     1,000 mg 200 mL/hr over 60 Minutes Intravenous Every 12 hours 06/01/13 1341 06/03/13 1010   06/01/13 1500  vancomycin (VANCOCIN) 1,500 mg in sodium chloride 0.9 % 500 mL IVPB     1,500 mg 250  mL/hr over 120 Minutes Intravenous  Once 06/01/13 1341 06/01/13 1706   06/01/13 1130  imipenem-cilastatin (PRIMAXIN) 500 mg in sodium chloride 0.9 % 100 mL IVPB     500 mg 200 mL/hr over 30 Minutes Intravenous Every 8 hours 06/01/13 1029     05/31/13 2315  [MAR Hold]  ertapenem (INVANZ) 1 g in sodium chloride 0.9 % 50 mL IVPB     (On MAR Hold since 05/31/13 2307)   1 g 100 mL/hr over 30 Minutes Intravenous  Once 05/31/13 2302 06/01/13 0110      Assessment: 67 yo female admitted on 9/4 for an emergent Hartmann's procedure for perforated diverticultis  Anticoagulation: SCDs, low plts 90 to 128 improved. HIT panel pending.  Infectious Disease: s/p ertapenem for peritonitis changed to Primaxin/Vanco 9/5 for concern of PNA. Recently at [facility]: treated with levaquin/zosyn for PNA. Then went to [facility] and was on Zosyn. Now also has cellulits on abdomen around stoma (necrotic--revision 9/8). Tmax 99.4. WBC 10.2. Primaxin for HCAP, stenotrophomonas tracheitis, peritonitis, cellulits  9/5 ertapenem>> 9/5 9/5 imipenem>>  9/5 vanco > 9/6  9/5 resp> mod GNR, mod stenotrophmonas maltophilia 9/5 blood x2> ngtd   Plan:  -Imipenem 500mg  IV q8h  - f/u HIT panel (9/5)  Sahirah Rudell S. Merilynn Finland, PharmD, BCPS Clinical Staff Pharmacist Pager [phone]  Misty Stanley Stillinger 06/04/2013,2:44 PM

## 2013-06-04 NOTE — Transfer of Care (Signed)
Immediate Anesthesia Transfer of Care Note  Patient: Carol Spencer  Procedure(s) Performed: Procedure(s): COLOSTOMY REVISION (N/A) EXPLORATORY LAPAROTOMY PARTIAL COLECTOMY  Patient Location: PACU  Anesthesia Type:General  Level of Consciousness: awake, alert  and oriented  Airway & Oxygen Therapy: Patient Spontanous Breathing and Patient connected to nasal cannula oxygen  Post-op Assessment: Report given to PACU RN, Post -op Vital signs reviewed and stable and Patient moving all extremities  Post vital signs: Reviewed and stable  Complications: No apparent anesthesia complications

## 2013-06-04 NOTE — Op Note (Signed)
COLOSTOMY REVISION, EXPLORATORY LAPAROTOMY, PARTIAL COLECTOMY  Procedure Note  Carol Spencer 05/31/2013 - 06/04/2013   Pre-op Diagnosis: ostomy necrosis, abdominal wall cellulitis     Post-op Diagnosis: same  Procedure(s): COLOSTOMY REVISION EXPLORATORY LAPAROTOMY PARTIAL COLECTOMY  Surgeon(s): Shelly Rubenstein, MD  Anesthesia: General  Staff:  Circulator: Lina Sayre, RN Scrub Person: Leighton Parody, CST  Estimated Blood Loss: Minimal                         Guerline Happ A   Date: 06/04/2013  Time: 12:27 PM

## 2013-06-04 NOTE — Progress Notes (Signed)
Patient ID: Carol Spencer, female   DOB: [birth year], 67 y.o.   MRN: [id number] 4 Days Post-Op  Subjective: Pt feels ok today.  Still c/o abdominal pain. + flatus in her bag  Objective: Vital signs in last 24 hours: Temp:  [98.2 F (36.8 C)-99.4 F (37.4 C)] 98.2 F (36.8 C) (09/08 0700) Pulse Rate:  [84-110] 106 (09/08 0700) Resp:  [16-30] 22 (09/08 0700) BP: (82-136)/(42-111) 108/60 mmHg (09/08 0700) SpO2:  [90 %-99 %] 97 % (09/08 0724) Weight:  [162 lb 4.1 oz (73.6 kg)] 162 lb 4.1 oz (73.6 kg) (09/08 0400)    Intake/Output from previous day: 09/07 0701 - 09/08 0700 In: 1550 [I.V.:950; IV Piggyback:600] Out: 3450 [Urine:3375; Emesis/NG output:75] Intake/Output this shift:    PE: Abd: soft, but tender, extension of her cellulitis around her stoma and extending up her abdomen and lateral to her left flank.  +BS, wound is clean, but base of her wound has some necrotic fat, but in general fatty tissue is becoming more pale. Heart: tachy   Lab Results:   Recent Labs  06/02/13 0524 06/03/13 0508  WBC 10.4 9.8  HGB 10.2* 9.6*  HCT 31.3* 29.1*  PLT 95* 98*   BMET  Recent Labs  06/03/13 0508 06/04/13 0520  NA 136 134*  K 3.1* 3.5  CL 95* 93*  CO2 32 30  GLUCOSE 131* 103*  BUN 14 13  CREATININE 0.43* 0.43*  CALCIUM 8.7 8.9   PT/INR No results found for this basename: LABPROT, INR,  in the last 72 hours CMP     Component Value Date/Time   NA 134* 06/04/2013 0520   K 3.5 06/04/2013 0520   CL 93* 06/04/2013 0520   CO2 30 06/04/2013 0520   GLUCOSE 103* 06/04/2013 0520   BUN 13 06/04/2013 0520   CREATININE 0.43* 06/04/2013 0520   CALCIUM 8.9 06/04/2013 0520   PROT 5.2* 06/02/2013 0524   ALBUMIN 2.4* 06/02/2013 0524   AST 16 06/02/2013 0524   ALT 39* 06/02/2013 0524   ALKPHOS 52 06/02/2013 0524   BILITOT 0.7 06/02/2013 0524   GFRNONAA >90 06/04/2013 0520   GFRAA >90 06/04/2013 0520   Lipase  No results found for this basename: lipase       Studies/Results: Dg Chest Port 1  View  06/04/2013   *RADIOLOGY REPORT*  Clinical Data: Assess atelectasis  PORTABLE CHEST - 1 VIEW  Comparison: Yesterday  Findings: NG tube stable.  Dense opacity at the left base is stable.  Stable hazy density at the right base.  No pneumothorax.  IMPRESSION: Stable bibasilar opacity left greater than right.   Original Report Authenticated By: Jolaine Click, M.D.   Dg Chest Port 1 View  06/03/2013   *RADIOLOGY REPORT*  Clinical Data: Assess effusion.  PORTABLE CHEST - 1 VIEW  Comparison: 06/01/2013 and 06/02/2013  Findings: The patient is rotated to the left.  Nasogastric tube courses into the region of the stomach and off the film as tip is not seen.  Lungs are somewhat hypoinflated with persistent left base opacification likely small left-sided effusion with atelectasis, although cannot exclude infection.  There is mild worsening of the right perihilar markings suggesting mild interstitial edema and less likely infection.  Remainder of the exam is unchanged.  IMPRESSION: Persistent left base opacification likely a small effusion with atelectasis although cannot exclude infection.  Mild worsening right perihilar markings likely mild interstitial edema and less likely infection.   Original Report Authenticated By: Elberta Fortis, M.D.  Anti-infectives: Anti-infectives   Start     Dose/Rate Route Frequency Ordered Stop   06/02/13 0300  vancomycin (VANCOCIN) IVPB 1000 mg/200 mL premix  Status:  Discontinued     1,000 mg 200 mL/hr over 60 Minutes Intravenous Every 12 hours 06/01/13 1341 06/03/13 1010   06/01/13 1500  vancomycin (VANCOCIN) 1,500 mg in sodium chloride 0.9 % 500 mL IVPB     1,500 mg 250 mL/hr over 120 Minutes Intravenous  Once 06/01/13 1341 06/01/13 1706   06/01/13 1130  imipenem-cilastatin (PRIMAXIN) 500 mg in sodium chloride 0.9 % 100 mL IVPB     500 mg 200 mL/hr over 30 Minutes Intravenous Every 8 hours 06/01/13 1029     05/31/13 2315  [MAR Hold]  ertapenem (INVANZ) 1 g in sodium  chloride 0.9 % 50 mL IVPB     (On MAR Hold since 05/31/13 2307)   1 g 100 mL/hr over 30 Minutes Intravenous  Once 05/31/13 2302 06/01/13 0110       Assessment/Plan  1. S/p Hartman's procedure for perforated diverticulitis 2. Necrotic stoma, with concern for extension of necrosis into abdominal cavity  3. Abdominal wall cellulitis 4. End stage COPD 5. OSA 6. DM 7. HTN  Plan: 1. I am concerned that the patient is having an extension of necrosis into her abdominal cavity which is why she is having a worsening cellulitis of her abdominal wall.  I will have Dr. Magnus Ivan evaluate her and make a final determination for OR today for revision of her colostomy and possible further bowel resection.  If he does not feel she needs OR then we can remove her NGT.  LOS: 4 days    Mande Auvil E 06/04/2013, 8:00 AM Pager: [phone]

## 2013-06-04 NOTE — Anesthesia Preprocedure Evaluation (Signed)
Anesthesia Evaluation  Patient identified by MRN, date of birth, ID band Patient awake    Reviewed: Allergy & Precautions, H&P , NPO status , Patient's Chart, lab work & pertinent test results  Airway Mallampati: III TM Distance: >3 FB Neck ROM: full    Dental no notable dental hx. (+) Teeth Intact and Dental Advisory Given   Pulmonary sleep apnea and Continuous Positive Airway Pressure Ventilation , COPD COPD inhaler and oxygen dependent,  breath sounds clear to auscultation  Pulmonary exam normal       Cardiovascular hypertension, On Medications Rhythm:regular Rate:Normal     Neuro/Psych PSYCHIATRIC DISORDERS negative neurological ROS     GI/Hepatic negative GI ROS, Neg liver ROS,   Endo/Other  diabetes, Type 1, Insulin DependentHypothyroidism   Renal/GU negative Renal ROS  negative genitourinary   Musculoskeletal   Abdominal   Peds  Hematology negative hematology ROS (+)   Anesthesia Other Findings   Reproductive/Obstetrics negative OB ROS                           Anesthesia Physical Anesthesia Plan  ASA: III  Anesthesia Plan: General   Post-op Pain Management:    Induction: Intravenous  Airway Management Planned: Oral ETT  Additional Equipment:   Intra-op Plan:   Post-operative Plan: Extubation in OR  Informed Consent: I have reviewed the patients History and Physical, chart, labs and discussed the procedure including the risks, benefits and alternatives for the proposed anesthesia with the patient or authorized representative who has indicated his/her understanding and acceptance.   Dental advisory given  Plan Discussed with: CRNA  Anesthesia Plan Comments:         Anesthesia Quick Evaluation

## 2013-06-05 ENCOUNTER — Encounter (HOSPITAL_COMMUNITY): Payer: Self-pay | Admitting: Surgery

## 2013-06-05 ENCOUNTER — Inpatient Hospital Stay (HOSPITAL_COMMUNITY): Payer: Medicare Other

## 2013-06-05 LAB — CBC WITH DIFFERENTIAL/PLATELET
Eosinophils Relative: 0 % (ref 0–5)
Lymphocytes Relative: 7 % — ABNORMAL LOW (ref 12–46)
Lymphs Abs: 0.7 10*3/uL (ref 0.7–4.0)
MCV: 88.8 fL (ref 78.0–100.0)
Neutrophils Relative %: 87 % — ABNORMAL HIGH (ref 43–77)
Platelets: 145 10*3/uL — ABNORMAL LOW (ref 150–400)
RBC: 3.58 MIL/uL — ABNORMAL LOW (ref 3.87–5.11)
WBC: 9.7 10*3/uL (ref 4.0–10.5)

## 2013-06-05 LAB — GLUCOSE, CAPILLARY
Glucose-Capillary: 133 mg/dL — ABNORMAL HIGH (ref 70–99)
Glucose-Capillary: 140 mg/dL — ABNORMAL HIGH (ref 70–99)

## 2013-06-05 LAB — HEPARIN INDUCED THROMBOCYTOPENIA PNL
Heparin Induced Plt Ab: NEGATIVE
UFH Low Dose 0.1 IU/mL: 11 % Release

## 2013-06-05 LAB — BASIC METABOLIC PANEL
CO2: 30 mEq/L (ref 19–32)
Glucose, Bld: 133 mg/dL — ABNORMAL HIGH (ref 70–99)
Potassium: 3.6 mEq/L (ref 3.5–5.1)
Sodium: 135 mEq/L (ref 135–145)

## 2013-06-05 MED ORDER — PANTOPRAZOLE SODIUM 40 MG PO TBEC
40.0000 mg | DELAYED_RELEASE_TABLET | Freq: Two times a day (BID) | ORAL | Status: DC
  Administered 2013-06-05 – 2013-06-07 (×5): 40 mg via ORAL
  Filled 2013-06-05 (×5): qty 1

## 2013-06-05 MED ORDER — SERTRALINE HCL 100 MG PO TABS
200.0000 mg | ORAL_TABLET | Freq: Every day | ORAL | Status: DC
  Administered 2013-06-05 – 2013-06-06 (×2): 200 mg via ORAL
  Filled 2013-06-05 (×3): qty 2

## 2013-06-05 MED ORDER — IPRATROPIUM BROMIDE 0.02 % IN SOLN
0.5000 mg | Freq: Four times a day (QID) | RESPIRATORY_TRACT | Status: DC
  Administered 2013-06-05 – 2013-06-07 (×7): 0.5 mg via RESPIRATORY_TRACT
  Filled 2013-06-05 (×9): qty 2.5

## 2013-06-05 MED ORDER — ALBUTEROL SULFATE (5 MG/ML) 0.5% IN NEBU
2.5000 mg | INHALATION_SOLUTION | RESPIRATORY_TRACT | Status: DC
  Administered 2013-06-05 (×2): 2.5 mg via RESPIRATORY_TRACT
  Filled 2013-06-05 (×2): qty 0.5

## 2013-06-05 MED ORDER — MORPHINE SULFATE 2 MG/ML IJ SOLN
1.0000 mg | INTRAMUSCULAR | Status: DC | PRN
  Administered 2013-06-05: 4 mg via INTRAVENOUS
  Administered 2013-06-05: 2 mg via INTRAVENOUS
  Administered 2013-06-05: 4 mg via INTRAVENOUS
  Administered 2013-06-05 (×2): 2 mg via INTRAVENOUS
  Administered 2013-06-06: 4 mg via INTRAVENOUS
  Administered 2013-06-06 (×3): 2 mg via INTRAVENOUS
  Administered 2013-06-06: 4 mg via INTRAVENOUS
  Administered 2013-06-06: 2 mg via INTRAVENOUS
  Administered 2013-06-07 (×3): 4 mg via INTRAVENOUS
  Filled 2013-06-05: qty 1
  Filled 2013-06-05: qty 2
  Filled 2013-06-05 (×2): qty 1
  Filled 2013-06-05 (×2): qty 2
  Filled 2013-06-05 (×2): qty 1
  Filled 2013-06-05: qty 2
  Filled 2013-06-05: qty 1
  Filled 2013-06-05 (×3): qty 2
  Filled 2013-06-05: qty 1

## 2013-06-05 MED ORDER — LEVOFLOXACIN IN D5W 750 MG/150ML IV SOLN: 750.0000 mg | INTRAVENOUS | Status: DC

## 2013-06-05 MED ORDER — IPRATROPIUM BROMIDE 0.02 % IN SOLN
0.5000 mg | Freq: Four times a day (QID) | RESPIRATORY_TRACT | Status: DC
  Administered 2013-06-05 (×2): 0.5 mg via RESPIRATORY_TRACT
  Filled 2013-06-05 (×2): qty 2.5

## 2013-06-05 MED ORDER — LEVOTHYROXINE SODIUM 50 MCG PO TABS
50.0000 ug | ORAL_TABLET | Freq: Every day | ORAL | Status: DC
  Administered 2013-06-06 – 2013-06-07 (×2): 50 ug via ORAL
  Filled 2013-06-05 (×3): qty 1

## 2013-06-05 MED ORDER — SULFAMETHOXAZOLE-TMP DS 800-160 MG PO TABS
1.0000 | ORAL_TABLET | Freq: Two times a day (BID) | ORAL | Status: DC
  Administered 2013-06-05: 1 via ORAL
  Filled 2013-06-05 (×2): qty 1

## 2013-06-05 MED ORDER — SULFAMETHOXAZOLE-TMP DS 800-160 MG PO TABS
2.0000 | ORAL_TABLET | Freq: Three times a day (TID) | ORAL | Status: DC
  Administered 2013-06-05 – 2013-06-07 (×6): 2 via ORAL
  Filled 2013-06-05 (×8): qty 2

## 2013-06-05 MED ORDER — ALBUTEROL SULFATE (5 MG/ML) 0.5% IN NEBU
2.5000 mg | INHALATION_SOLUTION | Freq: Four times a day (QID) | RESPIRATORY_TRACT | Status: DC
  Administered 2013-06-05 – 2013-06-07 (×7): 2.5 mg via RESPIRATORY_TRACT
  Filled 2013-06-05 (×9): qty 0.5

## 2013-06-05 MED ORDER — ATORVASTATIN CALCIUM 80 MG PO TABS
80.0000 mg | ORAL_TABLET | Freq: Every day | ORAL | Status: DC
  Administered 2013-06-05 – 2013-06-07 (×3): 80 mg via ORAL
  Filled 2013-06-05 (×3): qty 1

## 2013-06-05 MED ORDER — AMLODIPINE BESYLATE 5 MG PO TABS
5.0000 mg | ORAL_TABLET | Freq: Every day | ORAL | Status: DC
  Administered 2013-06-05 – 2013-06-07 (×3): 5 mg via ORAL
  Filled 2013-06-05 (×3): qty 1

## 2013-06-05 NOTE — Progress Notes (Signed)
PT Cancellation Note  Patient Details Name: Carol Spencer MRN: [id number] DOB: 1946-02-28   Cancelled Treatment:    Reason Eval/Treat Not Completed: Pain limiting ability to participate. Pt declined attempting OOB mobility despite extensive education on importance of OOB mobility. Pt with noted productive cough as well. PT to re-attempt as able.   Waylynn Benefiel, Becky Sax 06/05/2013, 11:16 AM

## 2013-06-05 NOTE — Consult Note (Signed)
WOC follow-up: Pt returned to surgery yesterday for revision of necrotic ostomy.  Left lower quad with new stoma location; red and viable and above skin level when visualized through pouch.  Current pouch intact with good seal, mod amt liquid brown drainage in pouch.  First day post-op; will demonstrate pouch change when feeling better in the next few days.  Educational materials have been given previously and there are no family members in the room.  Briefly discussed pouching routines and pt stated, "let's not talk about all that today."  Supplies at bedside for staff use. Cammie Mcgee MSN, RN, CWOCN, Fay, CNS [phone]

## 2013-06-05 NOTE — Progress Notes (Signed)
Patient refusing to wear CPAP at this time. RT will continue to monitor.  

## 2013-06-05 NOTE — Op Note (Signed)
Carol Spencer, Carol Spencer NO.:  [id number]  MEDICAL RECORD NO.:  [id number]  LOCATION:  2C14C                        FACILITY:  [facility]  PHYSICIAN:  Abigail Miyamoto, M.D. 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  PROCEDURE: 1. Exploratory laparotomy. 2. Revision of colostomy. 3. Partial colectomy.  SURGEON:  Abigail Miyamoto, M.D.  ASSISTANT:  Gabrielle Dare. Janee Morn, M.D.  ANESTHESIA:  General endotracheal anesthesia.  ESTIMATED BLOOD LOSS:  Minimal.  INDICATIONS:  This is a 67 year old female, who 4 days ago underwent exploratory laparotomy with a colostomy and Hartmann's procedure for perforated sigmoid diverticulitis.  She has now developed necrosis of the ostomy as well as cellulitis of the abdominal wall.  Decision was made to proceed to the operating room.  FINDINGS:  The patient was found to have necrosis which was complete necrosis of the first 2 cm of the ostomy.  The rest of the colon was pink and well perfused.  There was no tension to the ostomy and it was not tight at all coming to the abdominal wall.  There was no necrosis of the fascia of the abdominal wall and no purulence in the abdominal wall underneath the areas of cellulitis.  Decision was made to move the ostomy to a separate location.  PROCEDURE IN DETAIL:  The patient was brought to the operating room, identified as Carol Spencer.  She was placed supine on the operating table and general anesthesia was induced.  Her abdomen was then prepped and draped in the usual sterile fashion after the ostomy appliance was removed.  Again, the colostomy was necrotic and dead.  The patient's midline wound was already open.  I removed all the sutures from  the abdominal wall, and then eviscerated the abdominal contents.  I evaluated the ostomy and there was found to be no tension in the abdominal cavity.  I removed all the sutures around the ostomy and pulled the colon back into the abdominal cavity.  The remaining descending colon up to the area of necrosis was pink and well perfused, and again there was no tension.  At this point, I mobilized the colon along the white line of Toldt.  I examined the abdominal wall to the ostomy incision and examined the fascia and there was no evidence of necrosis or purulence.  There was a pocket in the subcutaneous tissue, however.  At this point, I took down the splenic flexure and mobilized the colon further toward the distal transverse colon.  At this point, inside out ostomy up out of the left upper quadrant.  I resected the redundant colon up to the ostomy with a GIA-75 stapler and took down the mesentery with the LigaSure.  I then made a separate elliptical incision in the left upper quadrant.  I took this down through the fascia which was opened in a cruciate fashion.  The underlying muscle fibers were retracted and the peritoneum was opened.  I then pulled out the distal transverse colon  as the ostomy.  Again, this appeared pink and well perfused with no tension.  I then closed the fascia at the previous ostomy with multiple figure-of-eight #1 Novafil sutures.  I then closed the anterior fascia with figure-of-eight #1 Novafil sutures as well.  I then closed the patient's midline fascia with a running #1 looped PDS suture.  I then packed the ostomy site in the midline wound with wet-to- dry Kerlix gauze.  An ostomy appliance was then applied.  The patient tolerated the procedure well.  All of the counts were correct at the end of the procedure.  The patient was then extubated in the operating room and taken in a stable condition to the recovery room.     Abigail Miyamoto, M.D.     DB/MEDQ   D:  06/04/2013  T:  06/05/2013  Job:  [id number]

## 2013-06-05 NOTE — Progress Notes (Signed)
Patient unable to wear CPAP due to NG tube. RT will continue to monitor.

## 2013-06-05 NOTE — Progress Notes (Signed)
I have seen and examined the patient and agree with the assessment and plans. NG out Wound care  Lindy Pennisi A. Magnus Ivan  MD, FACS

## 2013-06-05 NOTE — Progress Notes (Signed)
Patient ID: Carol Spencer, female   DOB: [birth year], 67 y.o.   MRN: [id number] 1 Day Post-Op  Subjective: Pt c/o back pain this morning.    Objective: Vital signs in last 24 hours: Temp:  [97.5 F (36.4 C)-99.1 F (37.3 C)] 98.7 F (37.1 C) (09/09 0753) Pulse Rate:  [102-119] 109 (09/09 0753) Resp:  [14-23] 19 (09/09 0753) BP: (130-166)/(61-91) 147/86 mmHg (09/09 0753) SpO2:  [93 %-100 %] 96 % (09/09 0753) Weight:  [175 lb 11.3 oz (79.7 kg)] 175 lb 11.3 oz (79.7 kg) (09/09 0427) Last BM Date:  (colostomy)  Intake/Output from previous day: 09/08 0701 - 09/09 0700 In: 2100 [I.V.:2000; IV Piggyback:100] Out: 890 [Urine:640; Emesis/NG output:150; Blood:100] Intake/Output this shift: Total I/O In: -  Out: 45 [Urine:45]  PE: Abd: soft, appropriately tender, +BS, some air and liquid stool in colostomy bag.  Midline wound and old ostomy wound site are clean and packed.  New stoma is pink and edematous, but functioning well Heart: regular, but tachy   Lab Results:   Recent Labs  06/04/13 0850 06/05/13 0452  WBC 10.2 9.7  HGB 10.2* 10.6*  HCT 31.1* 31.8*  PLT 128* 145*   BMET  Recent Labs  06/04/13 0520 06/05/13 0452  NA 134* 135  K 3.5 3.6  CL 93* 96  CO2 30 30  GLUCOSE 103* 133*  BUN 13 17  CREATININE 0.43* 0.35*  CALCIUM 8.9 8.9   PT/INR No results found for this basename: LABPROT, INR,  in the last 72 hours CMP     Component Value Date/Time   NA 135 06/05/2013 0452   K 3.6 06/05/2013 0452   CL 96 06/05/2013 0452   CO2 30 06/05/2013 0452   GLUCOSE 133* 06/05/2013 0452   BUN 17 06/05/2013 0452   CREATININE 0.35* 06/05/2013 0452   CALCIUM 8.9 06/05/2013 0452   PROT 5.2* 06/02/2013 0524   ALBUMIN 2.4* 06/02/2013 0524   AST 16 06/02/2013 0524   ALT 39* 06/02/2013 0524   ALKPHOS 52 06/02/2013 0524   BILITOT 0.7 06/02/2013 0524   GFRNONAA >90 06/05/2013 0452   GFRAA >90 06/05/2013 0452   Lipase  No results found for this basename: lipase       Studies/Results: Dg Chest Port  1 View  06/05/2013   *RADIOLOGY REPORT*  Clinical Data: Atelectasis, COPD  PORTABLE CHEST - 1 VIEW  Comparison: Prior chest x-ray 06/04/2013  Findings: The visualized portion of the nasogastric tube appears unchanged.  The tip lies below the diaphragm likely within the stomach.  Artifact from cardiac leads overlie the chest.  Slightly improved aeration of the medial left lung base.  There is persistent wedge-shaped opacity most consistent with improving atelectasis.  Also improving right perihilar and basilar atelectasis.  No pulmonary edema.  Difficult to exclude small left pleural effusion.  Cardiac and mediastinal contours remain unchanged with persistent right to left shift of the structures secondary to the lingular atelectasis.  Atherosclerotic calcifications noted in the transverse aorta.  IMPRESSION:  Improving left greater than right basilar atelectasis.   Original Report Authenticated By: Malachy Moan, M.D.   Dg Chest Port 1 View  06/04/2013   *RADIOLOGY REPORT*  Clinical Data: Assess atelectasis  PORTABLE CHEST - 1 VIEW  Comparison: Yesterday  Findings: NG tube stable.  Dense opacity at the left base is stable.  Stable hazy density at the right base.  No pneumothorax.  IMPRESSION: Stable bibasilar opacity left greater than right.   Original Report Authenticated By: Merton Border  Hoss, M.D.    Anti-infectives: Anti-infectives   Start     Dose/Rate Route Frequency Ordered Stop   06/02/13 0300  vancomycin (VANCOCIN) IVPB 1000 mg/200 mL premix  Status:  Discontinued     1,000 mg 200 mL/hr over 60 Minutes Intravenous Every 12 hours 06/01/13 1341 06/03/13 1010   06/01/13 1500  vancomycin (VANCOCIN) 1,500 mg in sodium chloride 0.9 % 500 mL IVPB     1,500 mg 250 mL/hr over 120 Minutes Intravenous  Once 06/01/13 1341 06/01/13 1706   06/01/13 1130  imipenem-cilastatin (PRIMAXIN) 500 mg in sodium chloride 0.9 % 100 mL IVPB     500 mg 200 mL/hr over 30 Minutes Intravenous Every 8 hours 06/01/13 1029      05/31/13 2315  [MAR Hold]  ertapenem (INVANZ) 1 g in sodium chloride 0.9 % 50 mL IVPB     (On MAR Hold since 05/31/13 2307)   1 g 100 mL/hr over 30 Minutes Intravenous  Once 05/31/13 2302 06/01/13 0110       Assessment/Plan  1. S/p Hartman's for perforated diverticulitis 2. S/p ostomy revision due to necrotic stoma 3. Abdominal wall cellulitis  4. COPD 5. HTN 6. DM 7. Post op ileus, resolving  Plan: 1. Patient has good bowel sounds and some output in her ostomy.  Will dc NGT today and start her on clear liquids 2. Routine ostomy care 3. Cont abx therapy for abdominal wall cellulitis, oddly enough most of her stoma was still viable, just the tip that had necrosed.  Unsure why the patient got this abdominal wall cellulitis 4. BID dressing changes to midline and old ostomy wound sites 5. Aggressive pulm toilet and mobilization  6. Hold off on TNA currently, as hopefully advancing diet now.  LOS: 5 days    Cindy Brindisi E 06/05/2013, 8:06 AM Pager: [phone]

## 2013-06-05 NOTE — Progress Notes (Signed)
PULMONARY  / CRITICAL CARE MEDICINE  Name: Carol Spencer MRN: [id number] DOB: 11-13-1945    ADMISSION DATE:  05/31/2013 CONSULTATION DATE:  05/31/2013  REFERRING MD :  Donell Beers PRIMARY SERVICE: PCCM  CHIEF COMPLAINT:  Post op vent management  BRIEF PATIENT DESCRIPTION: 67 y/o female with "end stage COPD" was admitted on 9/4 for an emergent Hartmann's procedure for perforated diverticultis.  Post operatively PCCM was asked to assume care.  SIGNIFICANT EVENTS / STUDIES:  8/21 Admitted [facility] COPD exacerbation, mycoplasma pneumonia 8/29 admitted [facility] 9/4 admitted [facility] perforated sigmoid colon 9/5- weaning well 9/6- BIPAP needed  9/7- neg 3 liters, improved resp status 9/8 Necrotic ostomy, will need revision to OR 9/8  LINES / TUBES: 9/4 ETT >>9/5  CULTURES: 9/5 blood >>neg 9/5 sputum>>>gram neg rod>>>mod stenotrophmonas maltophilia  ANTIBIOTICS: 9/5 Ertapenem >>9/5 9/5 Imipenem(abd wall cellulitis)>>> 9/5 vanc>>>9/7 9/9 bactrim (stenotrophomonas)>>  SUBJECTIVE:  C/o abd pain  VITAL SIGNS: Temp:  [97.5 F (36.4 C)-99.1 F (37.3 C)] 98.7 F (37.1 C) (09/09 0753) Pulse Rate:  [102-119] 109 (09/09 0753) Resp:  [14-23] 19 (09/09 0753) BP: (130-166)/(61-91) 147/86 mmHg (09/09 0753) SpO2:  [93 %-100 %] 96 % (09/09 0753) Weight:  [79.7 kg (175 lb 11.3 oz)] 79.7 kg (175 lb 11.3 oz) (09/09 0427)      INTAKE / OUTPUT: Intake/Output     09/08 0701 - 09/09 0700 09/09 0701 - 09/10 0700   I.V. (mL/kg) 2000 (25.1) 50 (0.6)   IV Piggyback 100    Total Intake(mL/kg) 2100 (26.3) 50 (0.6)   Urine (mL/kg/hr) 640 (0.3) 45 (0.2)   Emesis/NG output 150 (0.1)    Blood 100 (0.1)    Total Output 890 45   Net +1210 +5          PHYSICAL EXAMINATION:  Gen: chronically ill appearing, no distress in chair HEENT:PERRL PULM:CTa bases CV: RRR, no mgr, no JVD AB: decreased abd wall cellulitis Ext: warm,some edema , mild increase Derm: no rash or skin breakdown Neuro:  awake, alert   LABS:  CBC Recent Labs     06/03/13  0508  06/04/13  0850  06/05/13  0452  WBC  9.8  10.2  9.7  HGB  9.6*  10.2*  10.6*  HCT  29.1*  31.1*  31.8*  PLT  98*  128*  145*   Coag's No results found for this basename: APTT, INR,  in the last 72 hours BMET Recent Labs     06/03/13  0508  06/04/13  0520  06/05/13  0452  NA  136  134*  135  K  3.1*  3.5  3.6  CL  95*  93*  96  CO2  32  30  30  BUN  14  13  17   CREATININE  0.43*  0.43*  0.35*  GLUCOSE  131*  103*  133*   Electrolytes Recent Labs     06/03/13  0508  06/04/13  0520  06/05/13  0452  CALCIUM  8.7  8.9  8.9   Sepsis Markers No results found for this basename: LACTICACIDVEN, PROCALCITON, O2SATVEN,  in the last 72 hours ABG Recent Labs     06/03/13  0411  PHART  7.419  PCO2ART  51.7*  PO2ART  114.0*   Liver Enzymes No results found for this basename: AST, ALT, ALKPHOS, BILITOT, ALBUMIN,  in the last 72 hours Cardiac Enzymes No results found for this basename: TROPONINI, PROBNP,  in the last 72 hours Glucose Recent  Labs     06/04/13  1311  06/04/13  1546  06/04/13  1939  06/05/13  0048  06/05/13  0505  06/05/13  0801  GLUCAP  127*  142*  159*  133*  140*  125*    Imaging CXR:  9/9  Improved LLL ATX  ASSESSMENT / PLAN:  PULMONARY A: Gold D COPD HCAP LLL stenotrophomonas Left effusion  P:   -flutter valve -scheduled albuterol/atrovent    CARDIOVASCULAR A: Hypertension P:  -restart po HTN meds -tele  RENAL A:  No active issues P:   -bmet in am  -kvo  GASTROINTESTINAL A:  Perforated sigmoid colon, s/p Hartmann's procedure Now with necrotic ostomy s/p ostomy revision 9/8 P:   -advance diet   HEMATOLOGIC A:  Relative thrombocytopenia improving  P:  monitor  INFECTIOUS A:   stenotrophomonas tracheitis abd wall cellulitis/ necrotic stoma s/p revision 9/8 P:   Cont imipemin with cellulitis of abd wall   Start bactrim po for stenotrophomonas in  trachea  ENDOCRINE A:  DM2 Hypothyroidism P:   SSI -po synthroid to resume 9/9 NEUROLOGIC A:   Chronic benzo use prior to admission P:   -fentanyl prn  Pt desires referral back to LTAC but prefers [facility] referral , will see if insurance will cover   TODAY'S SUMMARY:  67 y.o.F with Gold D Copd s/p exlap for perf diverticulum. S/p OR 9/8 for stoma revision.  Plan: cont primaxin, add bactrim po, advance diet, resume PO home meds for BP.   Referral to LTAC.   Adella Hare Beeper  [phone]  Cell  [phone]  If no response or cell goes to voicemail, call beeper [phone]  Pulmonary and Critical Care Medicine [facility] Pager: [phone]  06/05/2013, 10:18 AM

## 2013-06-06 LAB — BASIC METABOLIC PANEL
CO2: 29 mEq/L (ref 19–32)
Calcium: 8.5 mg/dL (ref 8.4–10.5)
Chloride: 97 mEq/L (ref 96–112)
Creatinine, Ser: 0.36 mg/dL — ABNORMAL LOW (ref 0.50–1.10)
Glucose, Bld: 116 mg/dL — ABNORMAL HIGH (ref 70–99)
Potassium: 3.5 mEq/L (ref 3.5–5.1)

## 2013-06-06 LAB — CBC
HCT: 25.4 % — ABNORMAL LOW (ref 36.0–46.0)
Hemoglobin: 8.4 g/dL — ABNORMAL LOW (ref 12.0–15.0)
MCH: 29.7 pg (ref 26.0–34.0)
MCV: 89.8 fL (ref 78.0–100.0)
RBC: 2.83 MIL/uL — ABNORMAL LOW (ref 3.87–5.11)

## 2013-06-06 LAB — GLUCOSE, CAPILLARY
Glucose-Capillary: 109 mg/dL — ABNORMAL HIGH (ref 70–99)
Glucose-Capillary: 97 mg/dL (ref 70–99)

## 2013-06-06 MED ORDER — CHLORHEXIDINE GLUCONATE 0.12 % MT SOLN
15.0000 mL | Freq: Two times a day (BID) | OROMUCOSAL | Status: DC
  Administered 2013-06-07: 15 mL via OROMUCOSAL
  Filled 2013-06-06 (×4): qty 15

## 2013-06-06 MED ORDER — BIOTENE DRY MOUTH MT LIQD
15.0000 mL | Freq: Two times a day (BID) | OROMUCOSAL | Status: DC
  Administered 2013-06-07 (×2): 15 mL via OROMUCOSAL

## 2013-06-06 MED ORDER — POTASSIUM CHLORIDE CRYS ER 20 MEQ PO TBCR
40.0000 meq | EXTENDED_RELEASE_TABLET | Freq: Three times a day (TID) | ORAL | Status: AC
  Administered 2013-06-06 (×2): 40 meq via ORAL
  Filled 2013-06-06 (×2): qty 2

## 2013-06-06 NOTE — Progress Notes (Signed)
Physical Therapy Treatment Patient Details Name: Carol Spencer MRN: [id number] DOB: [birth year] Today's Date: 06/06/2013 Time: 1478-2956 PT Time Calculation (min): 23 min  PT Assessment / Plan / Recommendation  History of Present Illness     PT Comments   Pt cont's to c/o abdominal pain.  requires max encouragement to participate in therapy.  Pt refuses to sit in chair but she was agreeable to perform sit<>stand from bed.  Reiterated importance & benefits of OOB mobility & pt verbalizes understanding but cont's to refuse any mobility other than sit<>stand.     Follow Up Recommendations  LTACH     Does the patient have the potential to tolerate intense rehabilitation     Barriers to Discharge        Equipment Recommendations  None recommended by PT    Recommendations for Other Services    Frequency Min 3X/week   Progress towards PT Goals Progress towards PT goals: Progressing toward goals (slowly)  Plan Current plan remains appropriate    Precautions / Restrictions Precautions Precautions: Fall Restrictions Weight Bearing Restrictions: No   Pertinent Vitals/Pain C/o abdominal pain.  Did not rate.  Premedicated.      Mobility  Bed Mobility Bed Mobility: Sit to Sidelying Left;Left Sidelying to Sit;Sitting - Scoot to Edge of Bed Left Sidelying to Sit: 4: Min assist;With rails;HOB elevated Sitting - Scoot to Edge of Bed: 4: Min guard Sit to Sidelying Left: 4: Min assist;HOB flat Details for Bed Mobility Assistance: Cues for technique.  Increased time due to abdominal pain.  (A) to lift shoulders/trunk to sitting upright & to lift LE's back into bed.   Transfers Transfers: Sit to Stand;Stand to Sit Sit to Stand: 1: +2 Total assist;With upper extremity assist;From bed Sit to Stand: Patient Percentage: 70% Stand to Sit: 4: Min assist;To bed Details for Transfer Assistance: cues for hand placement.  Pt slow to achieve full upright standing due to pain.  Took 2 side steps  towards HOB.   Ambulation/Gait Ambulation/Gait Assistance: 1: +2 Total assist Ambulation/Gait: Patient Percentage: 70% Ambulation Distance (Feet):  (2 side steps towards [facility]) Assistive device: 2 person hand held assist Ambulation/Gait Assistance Details: (A) for balance.   Stairs: No Wheelchair Mobility Wheelchair Mobility: No      PT Goals (current goals can now be found in the care plan section) Acute Rehab PT Goals PT Goal Formulation: With patient Time For Goal Achievement: 06/17/13 Potential to Achieve Goals: Good  Visit Information  Last PT Received On: 06/06/13 Assistance Needed: +1    Subjective Data      Cognition  Cognition Arousal/Alertness: Awake/alert Behavior During Therapy: WFL for tasks assessed/performed Overall Cognitive Status: Within Functional Limits for tasks assessed    Balance     End of Session PT - End of Session Activity Tolerance: Patient limited by pain Patient left: in bed;with call bell/phone within reach;with family/visitor present Nurse Communication: Mobility status   GP     Lara Mulch 06/06/2013, 10:49 AM  Verdell Face, PTA [phone] 06/06/2013

## 2013-06-06 NOTE — Progress Notes (Signed)
On 06/05/2013 at 2300 RN assisted patient to dangle on side of bed and use incentive spirometer.  Pt. Tolerated well and was able to cough up small amount of sputum.  After dangled RN changed  patients  abdominal dressings per MD order.  Pt. Was premedicated and tolerated well. Pt. Vitals remained stable and call light in reach.  Will continue to monitor.    Thane Edu, RN

## 2013-06-06 NOTE — Progress Notes (Signed)
PULMONARY  / CRITICAL CARE MEDICINE  Name: Carol Spencer MRN: [id number] DOB: 20-Oct-1945    ADMISSION DATE:  05/31/2013 CONSULTATION DATE:  05/31/2013  REFERRING MD :  Donell Beers PRIMARY SERVICE: PCCM  CHIEF COMPLAINT:  Post op vent management  BRIEF PATIENT DESCRIPTION: 67 y/o female with "end stage COPD" was admitted on 9/4 for an emergent Hartmann's procedure for perforated diverticultis.  Post operatively PCCM was asked to assume care.  SIGNIFICANT EVENTS / STUDIES:  8/21 Admitted [facility] [facility] COPD exacerbation, mycoplasma pneumonia 8/29 admitted [facility] 9/4 admitted [facility] perforated sigmoid colon 9/5- weaning well 9/6- BIPAP needed  9/7- neg 3 liters, improved resp status 9/8 Necrotic ostomy, will need revision to OR 9/8  LINES / TUBES: 9/4 ETT >>9/5  CULTURES: 9/5 blood >>neg 9/5 sputum>>>gram neg rod>>>mod stenotrophmonas maltophilia  ANTIBIOTICS: 9/5 Ertapenem >>9/5 9/5 Imipenem(abd wall cellulitis)>>> 9/5 vanc>>>9/7 9/9 bactrim (stenotrophomonas)>>  SUBJECTIVE:  C/o abd pain  VITAL SIGNS: Temp:  [98.2 F (36.8 C)-99.2 F (37.3 C)] 98.2 F (36.8 C) (09/10 0731) Pulse Rate:  [86-110] 86 (09/10 0731) Resp:  [14-24] 14 (09/10 0731) BP: (111-179)/(70-89) 131/76 mmHg (09/10 0731) SpO2:  [93 %-100 %] 94 % (09/10 0731) Weight:  [79.7 kg (175 lb 11.3 oz)] 79.7 kg (175 lb 11.3 oz) (09/10 0400)     INTAKE / OUTPUT: Intake/Output     09/09 0701 - 09/10 0700 09/10 0701 - 09/11 0700   I.V. (mL/kg) 550 (6.9)    IV Piggyback 100    Total Intake(mL/kg) 650 (8.2)    Urine (mL/kg/hr) 520 (0.3)    Emesis/NG output     Stool 100 (0.1)    Blood     Total Output 620     Net +30           PHYSICAL EXAMINATION:  Gen: chronically ill appearing, no distress in chair HEENT:PERRL PULM:CTa bases CV: RRR, no mgr, no JVD AB: decreased abd wall cellulitis Ext: warm,some edema , mild increase Derm: no rash or skin breakdown Neuro: awake, alert  LABS:  CBC Recent  Labs     06/04/13  0850  06/05/13  0452  06/06/13  0435  WBC  10.2  9.7  8.2  HGB  10.2*  10.6*  8.4*  HCT  31.1*  31.8*  25.4*  PLT  128*  145*  124*   Coag's No results found for this basename: APTT, INR,  in the last 72 hours BMET Recent Labs     06/04/13  0520  06/05/13  0452  06/06/13  0435  NA  134*  135  135  K  3.5  3.6  3.5  CL  93*  96  97  CO2  30  30  29   BUN  13  17  13   CREATININE  0.43*  0.35*  0.36*  GLUCOSE  103*  133*  116*   Electrolytes Recent Labs     06/04/13  0520  06/05/13  0452  06/06/13  0435  CALCIUM  8.9  8.9  8.5   Sepsis Markers No results found for this basename: LACTICACIDVEN, PROCALCITON, O2SATVEN,  in the last 72 hours ABG No results found for this basename: PHART, PCO2ART, PO2ART,  in the last 72 hours Liver Enzymes No results found for this basename: AST, ALT, ALKPHOS, BILITOT, ALBUMIN,  in the last 72 hours Cardiac Enzymes No results found for this basename: TROPONINI, PROBNP,  in the last 72 hours Glucose Recent Labs     06/05/13  0801  06/05/13  1137  06/05/13  1602  06/05/13  2038  06/06/13  0025  06/06/13  0741  GLUCAP  125*  123*  162*  111*  108*  127*    Imaging CXR:  9/9  Improved LLL ATX  ASSESSMENT / PLAN:  PULMONARY A: Gold D COPD HCAP LLL stenotrophomonas Left effusion OSA at home on CPAP. P:   - Flutter valve - Scheduled albuterol/atrovent - IS per RT protocol. - Titrate O2 for sat of 88-92%. - Will make CPAP available while asleep if patient will use.  CARDIOVASCULAR A: Hypertension P:  - Continue po HTN meds, on Norvasc 5, can probably benefit from further increase to 10 but given pain (contributin to HTN) would like to get that under control then evaluate HTN. - Tele  RENAL A:  No active issues P:   - BMET in am. - KVO IVF. - Replace K and recheck in AM.  GASTROINTESTINAL A:  Perforated sigmoid colon, s/p Hartmann's procedure Now with necrotic ostomy s/p ostomy revision 9/8 P:    - Keep on liquid diet for now per surgery's recommendations.  HEMATOLOGIC A:  Relative thrombocytopenia improving P:  - CBC in AM.  INFECTIOUS A:   stenotrophomonas tracheitis abd wall cellulitis/ necrotic stoma s/p revision 9/8 P:   - Cont imipemin with cellulitis of abd wall. - Continue bactrim po for stenotrophomonas in trachea.  ENDOCRINE A:  DM2 Hypothyroidism P:   - SSI. - PO synthroid to resume 9/9.  NEUROLOGIC A:   Chronic benzo use prior to admission P:   - Morphine as ordered for pain control.  Pt desires referral back to LTAC but prefers [facility] , will defer to case management on planing once surgery feels that    TODAY'S SUMMARY:  67 y.o.F with Gold D Copd s/p exlap for perf diverticulum. S/p OR 9/8 for stoma revision.  Plan: cont primaxin, add bactrim po, advance diet, resume PO home meds for BP.   Referral to LTAC.  Alyson Reedy, M.D. [facility] Pulmonary/Critical Care Medicine. Pager: [phone]. After hours pager: [phone].

## 2013-06-06 NOTE — Progress Notes (Signed)
2 Days Post-Op  Subjective: Complains of mild pain, nausea, and anxiwty  Objective: Vital signs in last 24 hours: Temp:  [98.2 F (36.8 C)-99.2 F (37.3 C)] 98.2 F (36.8 C) (09/10 0400) Pulse Rate:  [87-110] 94 (09/10 0000) Resp:  [15-24] 21 (09/10 0000) BP: (111-179)/(70-89) 125/70 mmHg (09/10 0000) SpO2:  [93 %-100 %] 93 % (09/10 0000) Weight:  [175 lb 11.3 oz (79.7 kg)] 175 lb 11.3 oz (79.7 kg) (09/10 0400) Last BM Date: 06/05/13  Intake/Output from previous day: 09/09 0701 - 09/10 0700 In: 650 [I.V.:550; IV Piggyback:100] Out: 270 [Urine:220; Stool:50] Intake/Output this shift:    Abdomen mildly full.  Ostomy viable.  Abdominal wall erythema stable, wounds clean  Lab Results:   Recent Labs  06/05/13 0452 06/06/13 0435  WBC 9.7 8.2  HGB 10.6* 8.4*  HCT 31.8* 25.4*  PLT 145* 124*   BMET  Recent Labs  06/05/13 0452 06/06/13 0435  NA 135 135  K 3.6 3.5  CL 96 97  CO2 30 29  GLUCOSE 133* 116*  BUN 17 13  CREATININE 0.35* 0.36*  CALCIUM 8.9 8.5   PT/INR No results found for this basename: LABPROT, INR,  in the last 72 hours ABG No results found for this basename: PHART, PCO2, PO2, HCO3,  in the last 72 hours  Studies/Results: Dg Chest Port 1 View  06/05/2013   *RADIOLOGY REPORT*  Clinical Data: Atelectasis  PORTABLE CHEST - 1 VIEW  Comparison: 06/05/2013 04:23 hours  Findings: The nasogastric catheter has been removed.  The cardiac shadow is stable.  The right lung is clear. Persistent atelectasis in the left lung base is again noted.  No new focal abnormality is seen.  IMPRESSION: Stable left basilar atelectasis.   Original Report Authenticated By: Alcide Clever, M.D.   Dg Chest Port 1 View  06/05/2013   *RADIOLOGY REPORT*  Clinical Data: Atelectasis, COPD  PORTABLE CHEST - 1 VIEW  Comparison: Prior chest x-ray 06/04/2013  Findings: The visualized portion of the nasogastric tube appears unchanged.  The tip lies below the diaphragm likely within the stomach.   Artifact from cardiac leads overlie the chest.  Slightly improved aeration of the medial left lung base.  There is persistent wedge-shaped opacity most consistent with improving atelectasis.  Also improving right perihilar and basilar atelectasis.  No pulmonary edema.  Difficult to exclude small left pleural effusion.  Cardiac and mediastinal contours remain unchanged with persistent right to left shift of the structures secondary to the lingular atelectasis.  Atherosclerotic calcifications noted in the transverse aorta.  IMPRESSION:  Improving left greater than right basilar atelectasis.   Original Report Authenticated By: Malachy Moan, M.D.    Anti-infectives: Anti-infectives   Start     Dose/Rate Route Frequency Ordered Stop   06/05/13 2200  sulfamethoxazole-trimethoprim (BACTRIM DS) 800-160 MG per tablet 2 tablet     2 tablet Oral 3 times per day 06/05/13 1359     06/05/13 1100  sulfamethoxazole-trimethoprim (BACTRIM DS) 800-160 MG per tablet 1 tablet  Status:  Discontinued     1 tablet Oral Every 12 hours 06/05/13 1016 06/05/13 1359   06/05/13 1015  levofloxacin (LEVAQUIN) IVPB 750 mg  Status:  Discontinued     750 mg 100 mL/hr over 90 Minutes Intravenous Every 24 hours 06/05/13 1008 06/05/13 1015   06/02/13 0300  vancomycin (VANCOCIN) IVPB 1000 mg/200 mL premix  Status:  Discontinued     1,000 mg 200 mL/hr over 60 Minutes Intravenous Every 12 hours 06/01/13 1341  06/03/13 1010   06/01/13 1500  vancomycin (VANCOCIN) 1,500 mg in sodium chloride 0.9 % 500 mL IVPB     1,500 mg 250 mL/hr over 120 Minutes Intravenous  Once 06/01/13 1341 06/01/13 1706   06/01/13 1130  imipenem-cilastatin (PRIMAXIN) 500 mg in sodium chloride 0.9 % 100 mL IVPB     500 mg 200 mL/hr over 30 Minutes Intravenous Every 8 hours 06/01/13 1029     05/31/13 2315  [MAR Hold]  ertapenem (INVANZ) 1 g in sodium chloride 0.9 % 50 mL IVPB     (On MAR Hold since 05/31/13 2307)   1 g 100 mL/hr over 30 Minutes Intravenous   Once 05/31/13 2302 06/01/13 0110      Assessment/Plan: s/p Procedure(s): COLOSTOMY REVISION (N/A) EXPLORATORY LAPAROTOMY PARTIAL COLECTOMY  Continuing current wound care Keep on clear liquids  LOS: 6 days    Rafay Dahan A 06/06/2013

## 2013-06-06 NOTE — Progress Notes (Signed)
Pt is refusing to wear CPAP at this time. RT will continue to monitor 

## 2013-06-07 LAB — BASIC METABOLIC PANEL
BUN: 6 mg/dL (ref 6–23)
Calcium: 8.7 mg/dL (ref 8.4–10.5)
Creatinine, Ser: 0.42 mg/dL — ABNORMAL LOW (ref 0.50–1.10)
GFR calc non Af Amer: >90 mL/min (ref >90)
Glucose, Bld: 90 mg/dL (ref 70–99)
Sodium: 133 mEq/L — ABNORMAL LOW (ref 135–145)

## 2013-06-07 LAB — GLUCOSE, CAPILLARY
Glucose-Capillary: 105 mg/dL — ABNORMAL HIGH (ref 70–99)
Glucose-Capillary: 108 mg/dL — ABNORMAL HIGH (ref 70–99)
Glucose-Capillary: 111 mg/dL — ABNORMAL HIGH (ref 70–99)
Glucose-Capillary: 180 mg/dL — ABNORMAL HIGH (ref 70–99)
Glucose-Capillary: 91 mg/dL (ref 70–99)

## 2013-06-07 LAB — CULTURE, BLOOD (ROUTINE X 2): Culture: NO GROWTH

## 2013-06-07 LAB — CBC
Hemoglobin: 8.9 g/dL — ABNORMAL LOW (ref 12.0–15.0)
MCH: 29.6 pg (ref 26.0–34.0)
MCHC: 33 g/dL (ref 30.0–36.0)
MCV: 89.7 fL (ref 78.0–100.0)

## 2013-06-07 LAB — PHOSPHORUS: Phosphorus: 2.4 mg/dL (ref 2.3–4.6)

## 2013-06-07 MED ORDER — IPRATROPIUM BROMIDE 0.02 % IN SOLN: 0.5000 mg | Freq: Four times a day (QID) | RESPIRATORY_TRACT | Status: DC

## 2013-06-07 MED ORDER — PREDNISONE 20 MG PO TABS: 20.0000 mg | ORAL_TABLET | Freq: Every day | ORAL | Status: DC

## 2013-06-07 MED ORDER — ALBUTEROL SULFATE (5 MG/ML) 0.5% IN NEBU: 2.5000 mg | INHALATION_SOLUTION | Freq: Four times a day (QID) | RESPIRATORY_TRACT | Status: AC

## 2013-06-07 MED ORDER — SULFAMETHOXAZOLE-TMP DS 800-160 MG PO TABS: 2.0000 | ORAL_TABLET | Freq: Three times a day (TID) | ORAL | Status: DC

## 2013-06-07 MED ORDER — SODIUM CHLORIDE 0.9 % IV SOLN: 500.0000 mg | Freq: Three times a day (TID) | INTRAVENOUS | Status: DC

## 2013-06-07 MED ORDER — LORAZEPAM 2 MG/ML IJ SOLN: 0.5000 mg | Freq: Two times a day (BID) | INTRAMUSCULAR | Status: DC

## 2013-06-07 MED ORDER — PREDNISONE 20 MG PO TABS
20.0000 mg | ORAL_TABLET | Freq: Every day | ORAL | Status: DC
  Filled 2013-06-07 (×2): qty 1

## 2013-06-07 MED ORDER — INSULIN ASPART 100 UNIT/ML ~~LOC~~ SOLN: 2.0000 [IU] | SUBCUTANEOUS | Status: DC

## 2013-06-07 NOTE — Progress Notes (Signed)
3 Days Post-Op  Subjective: Comfortable this morning  Objective: Vital signs in last 24 hours: Temp:  [98 F (36.7 C)-98.4 F (36.9 C)] 98 F (36.7 C) (09/11 0458) Pulse Rate:  [81-100] 88 (09/11 0458) Resp:  [17-26] 18 (09/11 0458) BP: (115-132)/(61-68) 132/68 mmHg (09/11 0458) SpO2:  [92 %-95 %] 95 % (09/11 0458) Weight:  [171 lb 11.8 oz (77.9 kg)] 171 lb 11.8 oz (77.9 kg) (09/11 0458) Last BM Date: 06/06/13  Intake/Output from previous day: 09/10 0701 - 09/11 0700 In: 1900 [I.V.:1800; IV Piggyback:100] Out: 1105 [Urine:1075; Stool:30] Intake/Output this shift:    Abdomen soft, ostomy pink with gas in bag Wounds stable Abdominal wall improved  Lab Results:   Recent Labs  06/06/13 0435 06/07/13 0410  WBC 8.2 9.0  HGB 8.4* 8.9*  HCT 25.4* 27.0*  PLT 124* 141*   BMET  Recent Labs  06/06/13 0435 06/07/13 0410  NA 135 133*  K 3.5 4.4  CL 97 97  CO2 29 29  GLUCOSE 116* 90  BUN 13 6  CREATININE 0.36* 0.42*  CALCIUM 8.5 8.7   PT/INR No results found for this basename: LABPROT, INR,  in the last 72 hours ABG No results found for this basename: PHART, PCO2, PO2, HCO3,  in the last 72 hours  Studies/Results: Dg Chest Port 1 View  06/05/2013   *RADIOLOGY REPORT*  Clinical Data: Atelectasis  PORTABLE CHEST - 1 VIEW  Comparison: 06/05/2013 04:23 hours  Findings: The nasogastric catheter has been removed.  The cardiac shadow is stable.  The right lung is clear. Persistent atelectasis in the left lung base is again noted.  No new focal abnormality is seen.  IMPRESSION: Stable left basilar atelectasis.   Original Report Authenticated By: Alcide Clever, M.D.    Anti-infectives: Anti-infectives   Start     Dose/Rate Route Frequency Ordered Stop   06/05/13 2200  sulfamethoxazole-trimethoprim (BACTRIM DS) 800-160 MG per tablet 2 tablet     2 tablet Oral 3 times per day 06/05/13 1359     06/05/13 1100  sulfamethoxazole-trimethoprim (BACTRIM DS) 800-160 MG per tablet 1  tablet  Status:  Discontinued     1 tablet Oral Every 12 hours 06/05/13 1016 06/05/13 1359   06/05/13 1015  levofloxacin (LEVAQUIN) IVPB 750 mg  Status:  Discontinued     750 mg 100 mL/hr over 90 Minutes Intravenous Every 24 hours 06/05/13 1008 06/05/13 1015   06/02/13 0300  vancomycin (VANCOCIN) IVPB 1000 mg/200 mL premix  Status:  Discontinued     1,000 mg 200 mL/hr over 60 Minutes Intravenous Every 12 hours 06/01/13 1341 06/03/13 1010   06/01/13 1500  vancomycin (VANCOCIN) 1,500 mg in sodium chloride 0.9 % 500 mL IVPB     1,500 mg 250 mL/hr over 120 Minutes Intravenous  Once 06/01/13 1341 06/01/13 1706   06/01/13 1130  imipenem-cilastatin (PRIMAXIN) 500 mg in sodium chloride 0.9 % 100 mL IVPB     500 mg 200 mL/hr over 30 Minutes Intravenous Every 8 hours 06/01/13 1029     05/31/13 2315  [MAR Hold]  ertapenem (INVANZ) 1 g in sodium chloride 0.9 % 50 mL IVPB     (On MAR Hold since 05/31/13 2307)   1 g 100 mL/hr over 30 Minutes Intravenous  Once 05/31/13 2302 06/01/13 0110      Assessment/Plan: s/p Procedure(s): COLOSTOMY REVISION (N/A) EXPLORATORY LAPAROTOMY PARTIAL COLECTOMY  Wound care Full liquid diet  LOS: 7 days    Lesslie Mossa A 06/07/2013

## 2013-06-07 NOTE — Discharge Summary (Signed)
Physician Discharge Summary  Patient ID: Carol Spencer MRN: [id number] DOB/AGE: 67-18-1947 67 y.o.  Admit date: 05/31/2013 Discharge date: 06/07/2013    Discharge Diagnoses:  Principal Problem:   Perforated sigmoid colon Active Problems:   COPD (chronic obstructive pulmonary disease)   Acute respiratory failure with hypoxia   Hypertension   Hypothyroid   DM2 (diabetes mellitus, type 2)   Brief Hx - 67 yo female with "end-stage COPD" admitted 9/4 for emergent Harmanns procedure for perforated diverticulitis.   Remained vented post op and PCCM consulted for post op management.  Course c/b stenotrophamonas HCAP, Pt was extubated requiring intermittent PRN bipap.  Returned to OR 9/8 r/t necrotic ostomy stoma, now much improved.  Pt stable, on nasal cannula and ready for d/c back to [facility].    SIGNIFICANT EVENTS / STUDIES:  8/21 Admitted [facility] COPD exacerbation, mycoplasma pneumonia  8/29 admitted [facility]  9/4 admitted [facility] perforated sigmoid colon  9/5- weaning well  9/6- BIPAP needed  9/7- neg 3 liters, improved resp status  9/8 Necrotic ostomy, will need revision to OR 9/8    LINES / TUBES:  9/4 ETT >>9/5   CULTURES:  9/5 blood >>neg  9/5 sputum>>>gram neg rod>>>mod stenotrophmonas maltophilia   ANTIBIOTICS:  9/5 Ertapenem >>9/5  9/5 Imipenem(abd wall cellulitis)>>>  9/5 vanc>>>9/7  9/9 bactrim (stenotrophomonas)>>                                                                      DISCHARGE PLAN BY DIAGNOSIS    Gold D COPD  HCAP LLL stenotrophomonas  Left effusion  OSA at home on CPAP.   Discharge Plan: - Flutter valve.  - Scheduled albuterol/atrovent  - IS per RT protocol.  - Titrate O2 for sat of 88-92%.  - Would rec CPAP qhs if patient will use.  - prednisone 20 mg PO daily with 5 mg taper per week then D/C after a 4 week period.    Hypertension   Discharge Plan: - Continue po HTN meds, on Norvasc 5, can probably benefit from further  increase to 10 but given pain (contributin to HTN) would like to get that under control then evaluate HTN.     Perforated sigmoid colon, s/p Hartmann's procedure  Now with necrotic ostomy s/p ostomy revision 9/8   Discharge Plan: - Keep on liquid diet for now per surgery's recommendations and can advance once in [facility] per their surgeon's recommendations.   HEMATOLOGIC  A: Relative thrombocytopenia improving   Discharge Plan: - CBC in AM.   stenotrophomonas tracheitis  abd wall cellulitis/ necrotic stoma s/p revision 9/8   Discharge Plan: - Cont imipemin with cellulitis of abd wall for a total of 14 days. (started 9/5) - Continue bactrim po for stenotrophomonas in trachea for total of 14 days. (started 9/9)   DM2  Hypothyroidism   Discharge Plan: - SSI.  - PO synthroid    Chronic benzo use prior to admission  Discharge Plan: - PRN pain rx    Discharge Exam: Gen: chronically ill appearing, no distress in chair  HEENT:PERRL  PULM:CTa bases  CV: RRR, no mgr, no JVD  AB: decreased abd wall cellulitis  Ext: warm,some edema , mild increase  Derm: no rash or skin breakdown  Neuro: awake, alert :   Filed Vitals:   06/07/13 0803 06/07/13 1008 06/07/13 1140 06/07/13 1159  BP:  120/66  112/85  Pulse:    97  Temp:    98.4 F (36.9 C)  TempSrc:    Oral  Resp:    16  Height:      Weight:      SpO2: 94%  100% 94%     Discharge Labs  BMET  Recent Labs Lab 06/03/13 0508 06/04/13 0520 06/05/13 0452 06/06/13 0435 06/07/13 0410  NA 136 134* 135 135 133*  K 3.1* 3.5 3.6 3.5 4.4  CL 95* 93* 96 97 97  CO2 32 30 30 29 29   GLUCOSE 131* 103* 133* 116* 90  BUN 14 13 17 13 6   CREATININE 0.43* 0.43* 0.35* 0.36* 0.42*  CALCIUM 8.7 8.9 8.9 8.5 8.7  MG  --   --   --   --  2.0  PHOS  --   --   --   --  2.4   CBC  Recent Labs Lab 06/05/13 0452 06/06/13 0435 06/07/13 0410  HGB 10.6* 8.4* 8.9*  HCT 31.8* 25.4* 27.0*  WBC 9.7 8.2 9.0  PLT 145* 124* 141*    Anti-Coagulation  Recent Labs Lab 05/31/13 2310  INR 0.93     Medication List    STOP taking these medications       clonazePAM 0.5 MG tablet  Commonly known as:  KLONOPIN     hydrALAZINE 20 MG/ML injection  Commonly known as:  APRESOLINE     insulin detemir 100 UNIT/ML injection  Commonly known as:  LEVEMIR     methylPREDNISolone sodium succinate 125 mg/2 mL injection  Commonly known as:  SOLU-MEDROL     THEOPHYLLINE PO      TAKE these medications       acetaminophen 325 MG tablet  Commonly known as:  TYLENOL  Take 650 mg by mouth every 6 (six) hours as needed for pain or fever.     albuterol (5 MG/ML) 0.5% nebulizer solution  Commonly known as:  PROVENTIL  Take 0.5 mLs (2.5 mg total) by nebulization 4 (four) times daily.     ALPRAZolam 1 MG tablet  Commonly known as:  XANAX  Take 1 mg by mouth every 8 (eight) hours as needed for anxiety.     aluminum-magnesium hydroxide-simethicone 200-200-20 MG/5ML Susp  Commonly known as:  MAALOX  Take 30 mLs by mouth every 6 (six) hours as needed (heartburn, gas pains).     amLODipine 5 MG tablet  Commonly known as:  NORVASC  Take 5 mg by mouth daily.     atorvastatin 80 MG tablet  Commonly known as:  LIPITOR  Take 80 mg by mouth daily.     budesonide 0.5 MG/2ML nebulizer solution  Commonly known as:  PULMICORT  Take 0.5 mg by nebulization 2 (two) times daily.     cholecalciferol 1000 UNITS tablet  Commonly known as:  VITAMIN D  Take 1,000 Units by mouth daily.     cycloSPORINE 0.05 % ophthalmic emulsion  Commonly known as:  RESTASIS  Place 1 drop into both eyes 2 (two) times daily.     eucerin cream  Apply 1 application topically as needed for dry skin.     fluticasone 50 MCG/ACT nasal spray  Commonly known as:  FLONASE  Place 1 spray into the nose daily.     guaiFENesin 600 MG 12 hr tablet  Commonly known as:  MUCINEX  Take 600 mg by mouth 2 (two) times daily.     hydrOXYzine 25 MG capsule   Commonly known as:  VISTARIL  Take 25 mg by mouth 3 (three) times daily as needed for itching.     insulin aspart 100 UNIT/ML injection  Commonly known as:  novoLOG  Inject 2-6 Units into the skin every 4 (four) hours.     ipratropium 0.02 % nebulizer solution  Commonly known as:  ATROVENT  Take 2.5 mLs (0.5 mg total) by nebulization 4 (four) times daily.     levothyroxine 50 MCG tablet  Commonly known as:  SYNTHROID, LEVOTHROID  Take 50 mcg by mouth daily before breakfast.     lidocaine 5 %  Commonly known as:  LIDODERM  Place 1 patch onto the skin daily. Remove & Discard patch within 12 hours or as directed by MD     loratadine 10 MG tablet  Commonly known as:  CLARITIN  Take 10 mg by mouth daily.     LORazepam 2 MG/ML injection  Commonly known as:  ATIVAN  Inject 0.25 mLs (0.5 mg total) into the vein every 12 (twelve) hours.     morphine 4 MG/ML injection  Inject 4 mg into the vein every 4 (four) hours as needed for pain.     ondansetron 4 MG/2ML Soln injection  Commonly known as:  ZOFRAN  Inject 4 mg into the vein every 4 (four) hours as needed for nausea or vomiting.     oxyCODONE-acetaminophen 5-325 MG per tablet  Commonly known as:  PERCOCET/ROXICET  Take 1 tablet by mouth every 8 (eight) hours as needed for pain.     oxymetazoline 0.05 % nasal spray  Commonly known as:  AFRIN  Place 2 sprays into the nose 2 (two) times daily.     pantoprazole 40 MG tablet  Commonly known as:  PROTONIX  Take 40 mg by mouth daily.     predniSONE 20 MG tablet  Commonly known as:  DELTASONE  Take 1 tablet (20 mg total) by mouth daily with breakfast.     sertraline 100 MG tablet  Commonly known as:  ZOLOFT  Take 200 mg by mouth daily.     sodium chloride 0.65 % nasal spray  Commonly known as:  OCEAN  Place 1 spray into the nose every 4 (four) hours as needed for congestion.     sodium chloride 0.9 % SOLN 100 mL with imipenem-cilastatin 500 MG SOLR 500 mg  Inject 500 mg  into the vein every 8 (eight) hours.     sulfamethoxazole-trimethoprim 800-160 MG per tablet  Commonly known as:  BACTRIM DS  Take 2 tablets by mouth every 8 (eight) hours.     traMADol 50 MG tablet  Commonly known as:  ULTRAM  Take 25 mg by mouth every 6 (six) hours as needed for pain.          Disposition:  [facility]  Discharged Condition: Carol Spencer has met maximum benefit of inpatient care and is medically stable and cleared for discharge.  Patient is pending follow up as above.      Time spent on disposition:  Greater than 35 minutes.   SignedDanford Bad, NP 06/07/2013  3:27 PM Pager: [phone] or [phone]  *Care during the described time interval was provided by me and/or other providers on the critical care team. I have reviewed this patient's available data, including medical history, events of note, physical examination and test results  as part of my evaluation.

## 2013-06-07 NOTE — Progress Notes (Signed)
Physical Therapy Treatment Patient Details Name: Carol Spencer MRN: [id number] DOB: June 03, 1946 Today's Date: 06/07/2013 Time: 1111-1130 PT Time Calculation (min): 19 min  PT Assessment / Plan / Recommendation     PT Comments   Pt cont's to require encouragement for OOB mobility but she was agreeable to transition to recliner.    Follow Up Recommendations  LTACH     Does the patient have the potential to tolerate intense rehabilitation     Barriers to Discharge        Equipment Recommendations  None recommended by PT    Recommendations for Other Services    Frequency Min 3X/week   Progress towards PT Goals Progress towards PT goals: Progressing toward goals (slowly)  Plan Current plan remains appropriate    Precautions / Restrictions Precautions Precautions: Fall Restrictions Weight Bearing Restrictions: No   Pertinent Vitals/Pain Abdominal pain.  Premedicated with IV pain medication per RN.      Mobility  Bed Mobility Bed Mobility: Rolling Left;Left Sidelying to Sit Rolling Left: 5: Supervision;With rail Left Sidelying to Sit: 3: Mod assist;With rails Sitting - Scoot to Edge of Bed: 3: Mod assist Details for Bed Mobility Assistance: Incr time due to abdominal pain.  Cues for hand placement & technique.  (A) to lift shoulders/trunk to sitting upright & use of draw pad to bring hips fwds to EOB.   Transfers Transfers: Sit to Stand;Stand to Sit;Stand Pivot Transfers Sit to Stand: 3: Mod assist;With upper extremity assist;From bed Stand to Sit: 3: Mod assist;With upper extremity assist;With armrests;To chair/3-in-1 Stand Pivot Transfers: 3: Mod assist Details for Transfer Assistance: Cues for hand placement & technique.  Incr time.    Pt took 3 pivotal steps bed>recliner Ambulation/Gait Stairs: No      PT Goals (current goals can now be found in the care plan section) Acute Rehab PT Goals PT Goal Formulation: With patient Time For Goal Achievement:  06/17/13 Potential to Achieve Goals: Good  Visit Information  Last PT Received On: 06/07/13 Assistance Needed: +2 (helpful)    Subjective Data      Cognition  Cognition Arousal/Alertness: Awake/alert Behavior During Therapy: WFL for tasks assessed/performed Overall Cognitive Status: Within Functional Limits for tasks assessed    Balance  Static Sitting Balance Static Sitting - Balance Support: Left upper extremity supported Static Sitting - Level of Assistance: 5: Stand by assistance;4: Min assist Static Sitting - Comment/# of Minutes: Pt required min (A) initially due to posterior lean.  Pt leaning heavily to Lt side with difficulty adjusting hips to evenly distribute weight through bil hips.    End of Session PT - End of Session Activity Tolerance: Patient limited by pain Patient left: in chair;with call bell/phone within reach Nurse Communication: Mobility status   GP     Lara Mulch 06/07/2013, 12:02 PM  Verdell Face, PTA [phone] 06/07/2013

## 2013-06-07 NOTE — Progress Notes (Signed)
PULMONARY  / CRITICAL CARE MEDICINE  Name: Carol Spencer MRN: [id number] DOB: 1945-11-26    ADMISSION DATE:  05/31/2013 CONSULTATION DATE:  05/31/2013  REFERRING MD :  Donell Beers PRIMARY SERVICE: PCCM  CHIEF COMPLAINT:  Post op vent management  BRIEF PATIENT DESCRIPTION: 66 y/o female with "end stage COPD" was admitted on 9/4 for an emergent Hartmann's procedure for perforated diverticultis.  Post operatively PCCM was asked to assume care.  SIGNIFICANT EVENTS / STUDIES:  8/21 Admitted [facility] COPD exacerbation, mycoplasma pneumonia 8/29 admitted [facility] 9/4 admitted [facility] perforated sigmoid colon 9/5- weaning well 9/6- BIPAP needed  9/7- neg 3 liters, improved resp status 9/8 Necrotic ostomy, will need revision to OR 9/8  LINES / TUBES: 9/4 ETT >>9/5  CULTURES: 9/5 blood >>neg 9/5 sputum>>>gram neg rod>>>mod stenotrophmonas maltophilia  ANTIBIOTICS: 9/5 Ertapenem >>9/5 9/5 Imipenem(abd wall cellulitis)>>> 9/5 vanc>>>9/7 9/9 bactrim (stenotrophomonas)>>  SUBJECTIVE:  C/o abd pain  VITAL SIGNS: Temp:  [97.9 F (36.6 C)-98.4 F (36.9 C)] 97.9 F (36.6 C) (09/11 0754) Pulse Rate:  [88-106] 106 (09/11 0754) Resp:  [17-26] 22 (09/11 0754) BP: (115-133)/(61-93) 120/66 mmHg (09/11 1008) SpO2:  [92 %-95 %] 94 % (09/11 0803) Weight:  [77.9 kg (171 lb 11.8 oz)] 77.9 kg (171 lb 11.8 oz) (09/11 0458)     INTAKE / OUTPUT: Intake/Output     09/10 0701 - 09/11 0700 09/11 0701 - 09/12 0700   I.V. (mL/kg) 1800 (23.1)    IV Piggyback 100    Total Intake(mL/kg) 1900 (24.4)    Urine (mL/kg/hr) 1075 (0.6) 450 (1.5)   Stool 30 (0)    Total Output 1105 450   Net +795 -450        Urine Occurrence 3 x     PHYSICAL EXAMINATION:  Gen: chronically ill appearing, no distress in chair HEENT:PERRL PULM:CTa bases CV: RRR, no mgr, no JVD AB: decreased abd wall cellulitis Ext: warm,some edema , mild increase Derm: no rash or skin breakdown Neuro: awake,  alert  LABS:  CBC Recent Labs     06/05/13  0452  06/06/13  0435  06/07/13  0410  WBC  9.7  8.2  9.0  HGB  10.6*  8.4*  8.9*  HCT  31.8*  25.4*  27.0*  PLT  145*  124*  141*   Coag's No results found for this basename: APTT, INR,  in the last 72 hours BMET Recent Labs     06/05/13  0452  06/06/13  0435  06/07/13  0410  NA  135  135  133*  K  3.6  3.5  4.4  CL  96  97  97  CO2  30  29  29   BUN  17  13  6   CREATININE  0.35*  0.36*  0.42*  GLUCOSE  133*  116*  90   Electrolytes Recent Labs     06/05/13  0452  06/06/13  0435  06/07/13  0410  CALCIUM  8.9  8.5  8.7  MG   --    --   2.0  PHOS   --    --   2.4   Sepsis Markers No results found for this basename: LACTICACIDVEN, PROCALCITON, O2SATVEN,  in the last 72 hours ABG No results found for this basename: PHART, PCO2ART, PO2ART,  in the last 72 hours Liver Enzymes No results found for this basename: AST, ALT, ALKPHOS, BILITOT, ALBUMIN,  in the last 72 hours Cardiac Enzymes No results found for this  basename: TROPONINI, PROBNP,  in the last 72 hours Glucose Recent Labs     06/06/13  1210  06/06/13  1542  06/06/13  2057  06/07/13  0005  06/07/13  0500  06/07/13  0800  GLUCAP  109*  164*  97  111*  105*  91    Imaging CXR:  9/9  Improved LLL ATX  ASSESSMENT / PLAN:  PULMONARY A: Gold D COPD HCAP LLL stenotrophomonas Left effusion OSA at home on CPAP. P:   - Flutter valve. - Scheduled albuterol/atrovent - IS per RT protocol. - Titrate O2 for sat of 88-92%. - Will make CPAP available while asleep if patient will use. - D/C solumedrol and start prednisone 20 mg PO daily with 5 mg taper per week then D/C after a 4 week period.  CARDIOVASCULAR A: Hypertension P:  - Continue po HTN meds, on Norvasc 5, can probably benefit from further increase to 10 but given pain (contributin to HTN) would like to get that under control then evaluate HTN. - Tele  RENAL A:  No active issues P:   - BMET in  am unless ready for discharge. - KVO IVF. - Replace K as needed.  GASTROINTESTINAL A:  Perforated sigmoid colon, s/p Hartmann's procedure Now with necrotic ostomy s/p ostomy revision 9/8 P:   - Keep on liquid diet for now per surgery's recommendations and can advance once in [facility] per their surgeons recommendations.  HEMATOLOGIC A:  Relative thrombocytopenia improving P:  - CBC in AM.  INFECTIOUS A:   stenotrophomonas tracheitis abd wall cellulitis/ necrotic stoma s/p revision 9/8 P:   - Cont imipemin with cellulitis of abd wall for a total of 14 days. - Continue bactrim po for stenotrophomonas in trachea for total of 14 days.  ENDOCRINE A:  DM2 Hypothyroidism P:   - SSI. - PO synthroid to resume 9/9.  NEUROLOGIC A:   Chronic benzo use prior to admission P:   - Morphine as ordered for pain control.  Patient ok to go back to [facility], contacted Dr. Magnus Ivan, no further OR visits, feels ostomy will survive this time around and that further surgical interventions can be provided by [facility] since [facility] has no surgeons.  If bed is available will d/c today.  Alyson Reedy, M.D. [facility] Pulmonary/Critical Care Medicine. Pager: [phone]. After hours pager: [phone].

## 2013-06-07 NOTE — Consult Note (Signed)
WOC ostomy consult  Stoma type/location:LLQ- relocated stoma with surgery on 06/04/13  Stomal assessment/size:  2 inch stoma, well budded.  Pink and moist Peristomal assessment: Some post op bruising noted.  Skin intact.  Treatment options for stomal/peristomal skin: none needed.  Output minimal, loose, brown Ostomy pouching: /2pc. Stoma opening cut to 2 inch.  Lawson # [phone] 1 piece system will be more appropriate for this patient going forward.   Education provided:  Patient resistant to looking at stoma and feels uncomfortable with stoma care.  2 piece pouch placed on patient and patient performed pouch lock and roll closure.  Explained to empty pouch when 1/3 to half full.  1 piece system may be easier for patient to manage at a later date, when she is more comfortable with self care.  Will not follow at this time.  Please re-consult if needed.  Maple Hudson RN BSN CWON Pager [phone]

## 2013-06-07 NOTE — Progress Notes (Signed)
ANTIBIOTIC CONSULT NOTE - FOLLOW UP  Pharmacy Consult for Invanz Indication: HCAP, stenotrophomonas tracheitis, peritonitis, cellulits  Allergies  Allergen Reactions  . Moxifloxacin     unknown  . Spiriva [Tiotropium Bromide Monohydrate]     unknown    Patient Measurements: Height: 5\' 2"  (157.5 cm) Weight: 171 lb 11.8 oz (77.9 kg) IBW/kg (Calculated) : 50.1 Adjusted Body Weight:   Vital Signs: Temp: 97.9 F (36.6 C) (09/11 0754) Temp src: Oral (09/11 0754) BP: 133/93 mmHg (09/11 0754) Pulse Rate: 106 (09/11 0754) Intake/Output from previous day: 09/10 0701 - 09/11 0700 In: 1900 [I.V.:1800; IV Piggyback:100] Out: 1105 [Urine:1075; Stool:30] Intake/Output from this shift:    Labs:  Recent Labs  06/05/13 0452 06/06/13 0435 06/07/13 0410  WBC 9.7 8.2 9.0  HGB 10.6* 8.4* 8.9*  PLT 145* 124* 141*  CREATININE 0.35* 0.36* 0.42*   Estimated Creatinine Clearance: 65.9 ml/min (by C-G formula based on Cr of 0.42). No results found for this basename: VANCOTROUGH, Leodis Binet, VANCORANDOM, GENTTROUGH, GENTPEAK, GENTRANDOM, TOBRATROUGH, TOBRAPEAK, TOBRARND, AMIKACINPEAK, AMIKACINTROU, AMIKACIN,  in the last 72 hours   Microbiology: Recent Results (from the past 720 hour(s))  MRSA PCR SCREENING     Status: None   Collection Time    05/31/13 10:10 PM      Result Value Range Status   MRSA by PCR NEGATIVE  NEGATIVE Final   Comment:            The GeneXpert MRSA Assay (FDA     approved for NASAL specimens     only), is one component of a     comprehensive MRSA colonization     surveillance program. It is not     intended to diagnose MRSA     infection nor to guide or     monitor treatment for     MRSA infections.  CULTURE, RESPIRATORY (NON-EXPECTORATED)     Status: None   Collection Time    06/01/13  2:21 AM      Result Value Range Status   Specimen Description TRACHEAL ASPIRATE   Final   Special Requests NONE   Final   Gram Stain     Final   Value: RARE WBC  PRESENT,BOTH PMN AND MONONUCLEAR     NO SQUAMOUS EPITHELIAL CELLS SEEN     MODERATE GRAM NEGATIVE RODS     Performed at [facility]   Culture     Final   Value: MODERATE STENOTROPHOMONAS MALTOPHILIA     Performed at [facility]   Report Status 06/04/2013 FINAL   Final   Organism ID, Bacteria STENOTROPHOMONAS MALTOPHILIA   Final  CULTURE, BLOOD (ROUTINE X 2)     Status: None   Collection Time    06/01/13  3:30 AM      Result Value Range Status   Specimen Description BLOOD RIGHT ARM   Final   Special Requests     Final   Value: BOTTLES DRAWN AEROBIC AND ANAEROBIC 10CC BLUE,5CC RED   Culture  Setup Time     Final   Value: 06/01/2013 11:40     Performed at [facility]   Culture     Final   Value: NO GROWTH 5 DAYS     Performed at [facility]   Report Status 06/07/2013 FINAL   Final  CULTURE, BLOOD (ROUTINE X 2)     Status: None   Collection Time    06/01/13  3:35 AM      Result  Value Range Status   Specimen Description BLOOD RIGHT HAND   Final   Special Requests BOTTLES DRAWN AEROBIC ONLY 10CC   Final   Culture  Setup Time     Final   Value: 06/01/2013 11:40     Performed at [facility]   Culture     Final   Value: NO GROWTH 5 DAYS     Performed at [facility]   Report Status 06/07/2013 FINAL   Final    Anti-infectives   Start     Dose/Rate Route Frequency Ordered Stop   06/05/13 2200  sulfamethoxazole-trimethoprim (BACTRIM DS) 800-160 MG per tablet 2 tablet     2 tablet Oral 3 times per day 06/05/13 1359     06/05/13 1100  sulfamethoxazole-trimethoprim (BACTRIM DS) 800-160 MG per tablet 1 tablet  Status:  Discontinued     1 tablet Oral Every 12 hours 06/05/13 1016 06/05/13 1359   06/05/13 1015  levofloxacin (LEVAQUIN) IVPB 750 mg  Status:  Discontinued     750 mg 100 mL/hr over 90 Minutes Intravenous Every 24 hours 06/05/13 1008 06/05/13 1015   06/02/13 0300  vancomycin (VANCOCIN) IVPB 1000 mg/200 mL premix  Status:   Discontinued     1,000 mg 200 mL/hr over 60 Minutes Intravenous Every 12 hours 06/01/13 1341 06/03/13 1010   06/01/13 1500  vancomycin (VANCOCIN) 1,500 mg in sodium chloride 0.9 % 500 mL IVPB     1,500 mg 250 mL/hr over 120 Minutes Intravenous  Once 06/01/13 1341 06/01/13 1706   06/01/13 1130  imipenem-cilastatin (PRIMAXIN) 500 mg in sodium chloride 0.9 % 100 mL IVPB     500 mg 200 mL/hr over 30 Minutes Intravenous Every 8 hours 06/01/13 1029     05/31/13 2315  [MAR Hold]  ertapenem (INVANZ) 1 g in sodium chloride 0.9 % 50 mL IVPB     (On MAR Hold since 05/31/13 2307)   1 g 100 mL/hr over 30 Minutes Intravenous  Once 05/31/13 2302 06/01/13 0110      Assessment: 67 yo female admitted on 9/4 for an emergent Hartmann's procedure for perforated diverticultis  Anticoag: SCDs, low plts 90 now 141. HIT negative  ID: s/p ertapenem for peritonitis changed to Primaxin/Vanco 9/5 for concern of PNA. (Recently at [facility]: on levaquin/zosyn for PNA, then at [facility] and was on Zosyn.) Now has cellulits on abdomen around stoma (necrotic--revision 9/8). Afebrile WBC 9. Primaxin for HCAP, stenotrophomonas tracheitis, peritonitis, cellulits --Dr Delford Field changed his mind on covering Steno (since Primaxin does not cover) - MD added Bactrim on 9/9  9/5 ertapenem>> 9/5 9/5 imipenem (abd wall cellulits)>>  9/5 vanco > 9/6 9/9 Bactrim (stenotrophomonas)>>  9/5 resp> mod GNR, mod stenotrophmonas maltophilia 9/5 blood x2> ngtd  Cardiovascular: BP 133/93, HR 106 on Norvasc 5mg , Lipitor  Endocrinology: CBGs 97-164 on SSI, synthroid, steroids   Gastrointestinal / Nutrition: 9/4- emergent Hartmann's procedure for perforated diverticultis. 9/8: Ex lap with partial colectomy and colostomy revision. LLQ with new stoma location (necrosis of previous stoma). 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 2L 94%,   Hematology / Oncology: Thrombocytopenia; HIT negative   PTA Medication Issues: theophylline (plan d/c), along with several meds (clonazepam, levemir and others)  Best Practices: SCDs, PPI, mc  Plan:  -Imipenem 500mg  IV q8 day #7   Joselyne Spake S. Merilynn Finland, PharmD, BCPS Clinical Staff Pharmacist Pager [phone]  Misty Stanley [place] 06/07/2013,8:22 AM

## 2013-06-13 NOTE — Discharge Summary (Signed)
Patient seen and examined, agree with above note.  I dictated the care and orders written for this patient under my direction.  Wesam G Yacoub, MD [phone] 

## 2013-07-28 ENCOUNTER — Inpatient Hospital Stay (HOSPITAL_COMMUNITY): Payer: Medicare Other

## 2013-07-28 ENCOUNTER — Emergency Department (HOSPITAL_COMMUNITY): Payer: Medicare Other

## 2013-07-28 ENCOUNTER — Inpatient Hospital Stay (HOSPITAL_COMMUNITY)
Admission: EM | Admit: 2013-07-28 | Discharge: 2013-08-01 | DRG: 641 | Disposition: A | Discharge status: Home / Self Care | Payer: Medicare Other | Attending: Surgery | Admitting: Surgery

## 2013-07-28 ENCOUNTER — Encounter (HOSPITAL_COMMUNITY): Payer: Self-pay | Admitting: Emergency Medicine

## 2013-07-28 DIAGNOSIS — R109 Unspecified abdominal pain: Secondary | ICD-10-CM

## 2013-07-28 DIAGNOSIS — J4489 Other specified chronic obstructive pulmonary disease: Secondary | ICD-10-CM | POA: Diagnosis present

## 2013-07-28 DIAGNOSIS — G4733 Obstructive sleep apnea (adult) (pediatric): Secondary | ICD-10-CM | POA: Diagnosis present

## 2013-07-28 DIAGNOSIS — Z87891 Personal history of nicotine dependence: Secondary | ICD-10-CM

## 2013-07-28 DIAGNOSIS — K3184 Gastroparesis: Secondary | ICD-10-CM

## 2013-07-28 DIAGNOSIS — Z933 Colostomy status: Secondary | ICD-10-CM

## 2013-07-28 DIAGNOSIS — J449 Chronic obstructive pulmonary disease, unspecified: Secondary | ICD-10-CM | POA: Diagnosis present

## 2013-07-28 DIAGNOSIS — K551 Chronic vascular disorders of intestine: Secondary | ICD-10-CM

## 2013-07-28 DIAGNOSIS — I10 Essential (primary) hypertension: Secondary | ICD-10-CM | POA: Diagnosis present

## 2013-07-28 DIAGNOSIS — E785 Hyperlipidemia, unspecified: Secondary | ICD-10-CM | POA: Diagnosis present

## 2013-07-28 DIAGNOSIS — M1990 Unspecified osteoarthritis, unspecified site: Secondary | ICD-10-CM | POA: Diagnosis present

## 2013-07-28 DIAGNOSIS — K224 Dyskinesia of esophagus: Secondary | ICD-10-CM | POA: Diagnosis present

## 2013-07-28 DIAGNOSIS — R627 Adult failure to thrive: Principal | ICD-10-CM | POA: Diagnosis present

## 2013-07-28 DIAGNOSIS — E039 Hypothyroidism, unspecified: Secondary | ICD-10-CM | POA: Diagnosis present

## 2013-07-28 DIAGNOSIS — E119 Type 2 diabetes mellitus without complications: Secondary | ICD-10-CM | POA: Diagnosis present

## 2013-07-28 LAB — CBC WITH DIFFERENTIAL/PLATELET
Basophils Absolute: 0 10*3/uL (ref 0.0–0.1)
Basophils Relative: 0 % (ref 0–1)
Eosinophils Absolute: 0.2 10*3/uL (ref 0.0–0.7)
Eosinophils Relative: 4 % (ref 0–5)
MCH: 26.9 pg (ref 26.0–34.0)
MCHC: 30.4 g/dL (ref 30.0–36.0)
MCV: 88.5 fL (ref 78.0–100.0)
Neutrophils Relative %: 44 % (ref 43–77)
Platelets: 235 10*3/uL (ref 150–400)
RBC: 3.64 MIL/uL — ABNORMAL LOW (ref 3.87–5.11)
RDW: 14.9 % (ref 11.5–15.5)

## 2013-07-28 LAB — BASIC METABOLIC PANEL
Calcium: 9.1 mg/dL (ref 8.4–10.5)
GFR calc Af Amer: >90 mL/min (ref >90)
GFR calc non Af Amer: >90 mL/min (ref >90)
Glucose, Bld: 118 mg/dL — ABNORMAL HIGH (ref 70–99)
Potassium: 3.5 mEq/L (ref 3.5–5.1)
Sodium: 145 mEq/L (ref 135–145)

## 2013-07-28 LAB — GLUCOSE, CAPILLARY: Glucose-Capillary: 102 mg/dL — ABNORMAL HIGH (ref 70–99)

## 2013-07-28 MED ORDER — KCL IN DEXTROSE-NACL 20-5-0.45 MEQ/L-%-% IV SOLN
INTRAVENOUS | Status: DC
  Administered 2013-07-28 (×2): via INTRAVENOUS
  Filled 2013-07-28 (×7): qty 1000

## 2013-07-28 MED ORDER — AMLODIPINE BESYLATE 5 MG PO TABS
5.0000 mg | ORAL_TABLET | Freq: Every day | ORAL | Status: DC
  Administered 2013-07-28 – 2013-08-01 (×4): 5 mg via ORAL
  Filled 2013-07-28 (×5): qty 1

## 2013-07-28 MED ORDER — ENOXAPARIN SODIUM 40 MG/0.4ML ~~LOC~~ SOLN
40.0000 mg | SUBCUTANEOUS | Status: DC
  Administered 2013-07-28 – 2013-07-31 (×4): 40 mg via SUBCUTANEOUS
  Filled 2013-07-28 (×5): qty 0.4

## 2013-07-28 MED ORDER — ALBUTEROL SULFATE (5 MG/ML) 0.5% IN NEBU
2.5000 mg | INHALATION_SOLUTION | Freq: Four times a day (QID) | RESPIRATORY_TRACT | Status: DC
  Administered 2013-07-28 (×3): 2.5 mg via RESPIRATORY_TRACT
  Filled 2013-07-28 (×3): qty 0.5

## 2013-07-28 MED ORDER — FLUTICASONE PROPIONATE 50 MCG/ACT NA SUSP
1.0000 | Freq: Every day | NASAL | Status: DC
  Administered 2013-07-30 – 2013-08-01 (×3): 1 via NASAL
  Filled 2013-07-28: qty 16

## 2013-07-28 MED ORDER — ALBUTEROL SULFATE (5 MG/ML) 0.5% IN NEBU
2.5000 mg | INHALATION_SOLUTION | Freq: Four times a day (QID) | RESPIRATORY_TRACT | Status: DC
  Administered 2013-07-29 – 2013-07-31 (×8): 2.5 mg via RESPIRATORY_TRACT
  Filled 2013-07-28 (×8): qty 0.5

## 2013-07-28 MED ORDER — SERTRALINE HCL 100 MG PO TABS
200.0000 mg | ORAL_TABLET | Freq: Every day | ORAL | Status: DC
  Administered 2013-07-28 – 2013-08-01 (×5): 200 mg via ORAL
  Filled 2013-07-28 (×5): qty 2

## 2013-07-28 MED ORDER — LEVOTHYROXINE SODIUM 50 MCG PO TABS
50.0000 ug | ORAL_TABLET | Freq: Every day | ORAL | Status: DC
  Administered 2013-07-28 – 2013-08-01 (×5): 50 ug via ORAL
  Filled 2013-07-28 (×8): qty 1

## 2013-07-28 MED ORDER — ATORVASTATIN CALCIUM 80 MG PO TABS
80.0000 mg | ORAL_TABLET | Freq: Every day | ORAL | Status: DC
  Administered 2013-07-28 – 2013-07-31 (×4): 80 mg via ORAL
  Filled 2013-07-28 (×5): qty 1

## 2013-07-28 MED ORDER — MORPHINE SULFATE 2 MG/ML IJ SOLN
2.0000 mg | Freq: Once | INTRAMUSCULAR | Status: AC
  Administered 2013-07-28: 2 mg via INTRAVENOUS
  Filled 2013-07-28: qty 1

## 2013-07-28 MED ORDER — ONDANSETRON HCL 4 MG/2ML IJ SOLN
4.0000 mg | Freq: Four times a day (QID) | INTRAMUSCULAR | Status: DC | PRN
  Administered 2013-07-28 – 2013-08-01 (×7): 4 mg via INTRAVENOUS
  Filled 2013-07-28 (×6): qty 2

## 2013-07-28 MED ORDER — CYCLOSPORINE 0.05 % OP EMUL
1.0000 [drp] | Freq: Two times a day (BID) | OPHTHALMIC | Status: DC
  Administered 2013-07-28: 22:00:00 via OPHTHALMIC
  Administered 2013-07-28 – 2013-07-31 (×7): 1 [drp] via OPHTHALMIC
  Filled 2013-07-28 (×10): qty 1

## 2013-07-28 MED ORDER — PANTOPRAZOLE SODIUM 40 MG IV SOLR
40.0000 mg | Freq: Every day | INTRAVENOUS | Status: DC
  Administered 2013-07-28 – 2013-07-30 (×3): 40 mg via INTRAVENOUS
  Filled 2013-07-28 (×4): qty 40

## 2013-07-28 MED ORDER — MOMETASONE FURO-FORMOTEROL FUM 200-5 MCG/ACT IN AERO
2.0000 | INHALATION_SPRAY | Freq: Two times a day (BID) | RESPIRATORY_TRACT | Status: DC
  Administered 2013-07-28 – 2013-08-01 (×8): 2 via RESPIRATORY_TRACT
  Filled 2013-07-28: qty 8.8

## 2013-07-28 MED ORDER — ONDANSETRON HCL 4 MG/2ML IJ SOLN
4.0000 mg | Freq: Once | INTRAMUSCULAR | Status: AC
  Administered 2013-07-28: 4 mg via INTRAVENOUS
  Filled 2013-07-28: qty 2

## 2013-07-28 MED ORDER — MORPHINE SULFATE 2 MG/ML IJ SOLN
1.0000 mg | INTRAMUSCULAR | Status: DC | PRN
  Administered 2013-07-28: 2 mg via INTRAVENOUS
  Administered 2013-07-28: 1 mg via INTRAVENOUS
  Administered 2013-07-28 – 2013-08-01 (×12): 2 mg via INTRAVENOUS
  Filled 2013-07-28 (×16): qty 1

## 2013-07-28 NOTE — ED Notes (Signed)
Xray ordered for 11/2 1200am, will confirm.

## 2013-07-28 NOTE — ED Notes (Signed)
Patient transported to X-ray 

## 2013-07-28 NOTE — ED Notes (Signed)
Back from xray, alert, NAD, calm, interactive.  

## 2013-07-28 NOTE — ED Provider Notes (Signed)
CSN: [id number]     Arrival date & time 07/28/13  0415 History   First MD Initiated Contact with Patient 07/28/13 [phone]     Chief Complaint  Patient presents with  . Abdominal Pain  . Nausea   (Consider location/radiation/quality/duration/timing/severity/associated sxs/prior Treatment) Patient is a 67 y.o. female presenting with abdominal pain. The history is provided by the patient.  Abdominal Pain She was transferred here from Hospital in [place] because of an abnormal CT scan. She had a colostomy placed 2 months ago because of perforated diverticulitis. She been followed at the wound center in [employer] by visiting nurse was worried about the wound and sent her to the ED where CT was obtained and arrangements were made to transfer her here. She states that she has been having abdominal pain since the surgery but has gotten worse over the last several days. There's been nausea with mild vomiting. Colostomy has been functioning. She denies fever or chills. Past Medical History  Diagnosis Date  . COPD (chronic obstructive pulmonary disease)   . OSA on CPAP   . DM2 (diabetes mellitus, type 2)   . HTN (hypertension)   . Hyperlipidemia   . Depression   . Osteoarthritis   . Diverticulosis   . Anxiety    Past Surgical History  Procedure Laterality Date  . Laparotomy N/A 05/31/2013    Procedure: EXPLORATORY LAPAROTOMY;  Surgeon: Almond Lint, MD;  Location: [facility] OR;  Service: General;  Laterality: N/A;  . Colostomy revision N/A 05/31/2013    Procedure: COLON RESECTION SIGMOID;  Surgeon: Almond Lint, MD;  Location: [facility] OR;  Service: General;  Laterality: N/A;  with End colostomy  . Colostomy revision N/A 06/04/2013    Procedure: COLOSTOMY REVISION;  Surgeon: Shelly Rubenstein, MD;  Location: [facility] OR;  Service: General;  Laterality: N/A;  . Laparotomy  06/04/2013    Procedure: EXPLORATORY LAPAROTOMY;  Surgeon: Shelly Rubenstein, MD;  Location: [facility] OR;  Service: General;;  . Partial colectomy   06/04/2013    Procedure: PARTIAL COLECTOMY;  Surgeon: Shelly Rubenstein, MD;  Location: [facility] OR;  Service: General;;   Family History  Problem Relation Age of Onset  . Breast cancer Mother   . Aortic aneurysm Father    History  Substance Use Topics  . Smoking status: Former Smoker    Types: Cigarettes    Quit date: 11/25/2012  . Smokeless tobacco: Not on file  . Alcohol Use: No   OB History   Grav Para Term Preterm Abortions TAB SAB Ect Mult Living                 Review of Systems  Gastrointestinal: Positive for abdominal pain.  All other systems reviewed and are negative.    Allergies  Moxifloxacin and Spiriva  Home Medications   Current Outpatient Rx  Name  Route  Sig  Dispense  Refill  . albuterol (PROVENTIL) (5 MG/ML) 0.5% nebulizer solution   Nebulization   Take 0.5 mLs (2.5 mg total) by nebulization 4 (four) times daily.   20 mL   12   . amLODipine (NORVASC) 5 MG tablet   Oral   Take 5 mg by mouth daily.         . cycloSPORINE (RESTASIS) 0.05 % ophthalmic emulsion   Both Eyes   Place 1 drop into both eyes 2 (two) times daily.         . fluticasone (FLONASE) 50 MCG/ACT nasal spray   Nasal   Place  1 spray into the nose daily.         . Fluticasone-Salmeterol (ADVAIR) 500-50 MCG/DOSE AEPB   Inhalation   Inhale 1 puff into the lungs every 12 (twelve) hours.         Marland Kitchen levothyroxine (SYNTHROID, LEVOTHROID) 50 MCG tablet   Oral   Take 50 mcg by mouth daily before breakfast.         . rosuvastatin (CRESTOR) 40 MG tablet   Oral   Take 40 mg by mouth at bedtime.         . sertraline (ZOLOFT) 100 MG tablet   Oral   Take 200 mg by mouth daily.         Marland Kitchen acetaminophen (TYLENOL) 325 MG tablet   Oral   Take 650 mg by mouth every 6 (six) hours as needed for pain or fever.         Marland Kitchen aluminum-magnesium hydroxide-simethicone (MAALOX) 200-200-20 MG/5ML SUSP   Oral   Take 30 mLs by mouth every 6 (six) hours as needed (heartburn, gas pains).          . cholecalciferol (VITAMIN D) 1000 UNITS tablet   Oral   Take 1,000 Units by mouth daily.         Marland Kitchen guaiFENesin (MUCINEX) 600 MG 12 hr tablet   Oral   Take 600 mg by mouth 2 (two) times daily.         . hydrOXYzine (VISTARIL) 25 MG capsule   Oral   Take 25 mg by mouth 3 (three) times daily as needed for itching.         . insulin aspart (NOVOLOG) 100 UNIT/ML injection   Subcutaneous   Inject 2-6 Units into the skin every 4 (four) hours.   1 vial   12   . ipratropium (ATROVENT) 0.02 % nebulizer solution   Nebulization   Take 2.5 mLs (0.5 mg total) by nebulization 4 (four) times daily.   75 mL   12   . lidocaine (LIDODERM) 5 %   Transdermal   Place 1 patch onto the skin daily. Remove & Discard patch within 12 hours or as directed by MD         . loratadine (CLARITIN) 10 MG tablet   Oral   Take 10 mg by mouth daily.         Marland Kitchen LORazepam (ATIVAN) 2 MG/ML injection   Intravenous   Inject 0.25 mLs (0.5 mg total) into the vein every 12 (twelve) hours.   1 mL   0   . morphine 4 MG/ML injection   Intravenous   Inject 4 mg into the vein every 4 (four) hours as needed for pain.         Marland Kitchen ondansetron (ZOFRAN) 4 MG/2ML SOLN injection   Intravenous   Inject 4 mg into the vein every 4 (four) hours as needed for nausea or vomiting.         Marland Kitchen oxyCODONE-acetaminophen (PERCOCET/ROXICET) 5-325 MG per tablet   Oral   Take 1 tablet by mouth every 8 (eight) hours as needed for pain.         Marland Kitchen oxymetazoline (AFRIN) 0.05 % nasal spray   Nasal   Place 2 sprays into the nose 2 (two) times daily.         . pantoprazole (PROTONIX) 40 MG tablet   Oral   Take 40 mg by mouth daily.         . predniSONE (DELTASONE) 20 MG tablet  Oral   Take 1 tablet (20 mg total) by mouth daily with breakfast.         . Skin Protectants, Misc. (EUCERIN) cream   Topical   Apply 1 application topically as needed for dry skin.         . sodium chloride (OCEAN) 0.65 %  nasal spray   Nasal   Place 1 spray into the nose every 4 (four) hours as needed for congestion.         . sodium chloride 0.9 % SOLN 100 mL with imipenem-cilastatin 500 MG SOLR 500 mg   Intravenous   Inject 500 mg into the vein every 8 (eight) hours.         Marland Kitchen sulfamethoxazole-trimethoprim (BACTRIM DS) 800-160 MG per tablet   Oral   Take 2 tablets by mouth every 8 (eight) hours.         . traMADol (ULTRAM) 50 MG tablet   Oral   Take 25 mg by mouth every 6 (six) hours as needed for pain.          BP 103/41  Pulse 86  Temp(Src) 98.2 F (36.8 C) (Oral)  Resp 18  SpO2 96% Physical Exam  Nursing note and vitals reviewed.  67 year old female, resting comfortably and in no acute distress. Vital signs are  normal. Oxygen saturation is 96%, which is normal. Head is normocephalic and atraumatic. PERRLA, EOMI. Oropharynx is clear. Neck is nontender and supple without adenopathy or JVD. Back is nontender and there is no CVA tenderness. Lungs are clear without rales, wheezes, or rhonchi. Chest is nontender. Heart has regular rate and rhythm without murmur. Abdomen is soft, flat, with mild tenderness across the upper abdomen. Pain is present in the right midabdomen. Midline scar is present with dressing over the dressing is not removed. Colostomy is present left lower quadrant. There are no  masses or hepatosplenomegaly and peristalsis is normoactive. Extremities have no cyanosis or edema, full range of motion is present. Skin is warm and dry without rash. Neurologic: Mental status is normal, cranial nerves are intact, there are no motor or sensory deficits.  ED Course  Procedures (including critical care time)  MDM   1. Abdominal pain    Postoperative abdominal pain of uncertain cause. Old records are reviewed and she had a colostomy for an perforated diverticulitis with surgery in September 4 and revision of colostomy on September 8. 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 Clinically, this is not the case. She had normal labs including a normal lactic acid and she's not in nearly as much distress as I would expect from SMA syndrome. This has been seen and evaluated by Dr. Lindie Spruce who agrees to admit the patient.     Dione Booze, MD 07/28/13 [phone]

## 2013-07-28 NOTE — ED Notes (Addendum)
Here from [facility] by EMS. Here to see general surgery Dr. Lindie Spruce. Dr. Lindie Spruce at [facility] on arrival. C/o abd pain and nausea.  Pt alert, NAD, calm, interactive, resps e/u, speaking in clear complete sentences. Wound VAC top R abd. Colostomy to L abd. Last ate 1700. Last emptied colostomy bag at ~ 1830. "has been a little runny today". PIV in L FA. Sent by wound center to ED, transferred from [facility] to [facility] ED with concern for blockage. Paperwork and films with pt. BP 118/64 PTA. Recent suregy by Dr. Donell Beers. Morphine, zofran, phenergan & 1LNS given PTA.

## 2013-07-28 NOTE — ED Notes (Signed)
Pt declines NGT, states, "cannot do it, they haven't been able to in the past". Plan process explained. Pt describing bronchoscopy. Pending xray.

## 2013-07-28 NOTE — ED Notes (Signed)
Pt in xray

## 2013-07-28 NOTE — ED Notes (Signed)
Xray notified of acute abd film

## 2013-07-28 NOTE — ED Notes (Signed)
Pharm tech at [facility] ?

## 2013-07-28 NOTE — H&P (Signed)
Carol Spencer is an 67 y.o. female.   Chief Complaint: Sent here from [facility] because of concern about SMA syndrome. HPI: The patient has been followed by a physician at a [place] wound care facility for her open abdominal wound being treated with a negative pressure wound dressing.  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  For this she was shipped by ambulance to [facility] for further evaluation.  The patient has had no abdominal pain, but is currently a bit tender.  No nausea or vomiting, but very poor appetite.  Her colostomy has been functioning.    Past Medical History  Diagnosis Date  . COPD (chronic obstructive pulmonary disease)   . OSA on CPAP   . DM2 (diabetes mellitus, type 2)   . HTN (hypertension)   . Hyperlipidemia   . Depression   . Osteoarthritis   . Diverticulosis   . Anxiety     Past Surgical History  Procedure Laterality Date  . Laparotomy N/A 05/31/2013    Procedure: EXPLORATORY LAPAROTOMY;  Surgeon: Almond Lint, MD;  Location: [facility] OR;  Service: General;  Laterality: N/A;  . Colostomy revision N/A 05/31/2013    Procedure: COLON RESECTION SIGMOID;  Surgeon: Almond Lint, MD;  Location: [facility] OR;  Service: General;  Laterality: N/A;  with End colostomy  . Colostomy revision N/A 06/04/2013    Procedure: COLOSTOMY REVISION;  Surgeon: Shelly Rubenstein, MD;  Location: [facility] OR;  Service: General;  Laterality: N/A;  . Laparotomy  06/04/2013    Procedure: EXPLORATORY LAPAROTOMY;  Surgeon: Shelly Rubenstein, MD;  Location: [facility] OR;  Service: General;;  . Partial colectomy  06/04/2013    Procedure: PARTIAL COLECTOMY;  Surgeon: Shelly Rubenstein, MD;  Location: [facility] OR;  Service: General;;    Family History  Problem Relation Age of Onset  . Breast cancer Mother   . Aortic aneurysm Father    Social History:  reports that she  quit smoking about 8 months ago. Her smoking use included Cigarettes. She smoked 0.00 packs per day. She does not have any smokeless tobacco history on file. She reports that she does not drink alcohol or use illicit drugs.  Allergies:  Allergies  Allergen Reactions  . Moxifloxacin     unknown  . Spiriva [Tiotropium Bromide Monohydrate]     unknown     (Not in a hospital admission)  No results found for this or any previous visit (from the past 48 hour(s)). No results found.  Review of Systems  Constitutional: Negative for fever and chills.  HENT: Negative.   Eyes: Negative.   Respiratory: Negative.   Cardiovascular: Negative.   Gastrointestinal: Negative for nausea, vomiting and diarrhea.  Genitourinary: Negative.   Musculoskeletal: Negative.   Neurological: Negative.   Endo/Heme/Allergies: Negative.   Psychiatric/Behavioral: Negative.     Blood pressure 103/41, pulse 86, temperature 98.2 F (36.8 C), temperature source Oral, resp. rate 18, SpO2 96.00%. Physical Exam  Constitutional: She appears well-developed and well-nourished.  HENT:  Head: Normocephalic and atraumatic.  Eyes: Conjunctivae and EOM are normal. Pupils are equal, round, and reactive to light.  Neck: Normal range of motion. Neck supple.  Cardiovascular: Normal rate and normal heart sounds.   Respiratory: Effort normal and breath sounds normal.  GI: Soft. Bowel sounds are normal. There is generalized tenderness (very mild). There is no rigidity, no  rebound and no guarding.    Musculoskeletal: Normal range of motion.  Neurological: She is alert.     Assessment/Plan ?SMA syndrome MMP  Needs verification of obstructive process  Will admit for NGT decompression, but patient is refusion NGT.  Will get UGI/SBFT to look for obstructive site.  Cherylynn Ridges 07/28/2013, 4:41 AM

## 2013-07-28 NOTE — Progress Notes (Signed)
INITIAL NUTRITION ASSESSMENT  DOCUMENTATION CODES Per approved criteria  -Severe malnutrition in the context of chronic illness   INTERVENTION: Recommend Resource Breeze po BID, each supplement provides 250 kcal and 9 grams of protein, once diet order permits. Monitor magnesium, potassium, and phosphorus daily for at least 3 days as pt receives nutrition, MD to replete as needed, as pt is at risk for refeeding syndrome given severe malnutrition. RD to continue to follow nutrition care plan.  NUTRITION DIAGNOSIS: Inadequate oral intake related to poor appetite as evidenced by dietary recall and ongoing weight loss.   Goal: Advance diet as tolerated. Intake to meet at least 90% of estimated needs.  Monitor:  weight trends, lab trends, I/O's, diet advancement, supplement tolerance  Reason for Assessment: Malnutrition Screening Tool  67 y.o. female  Admitting Dx: SMA syndrome  ASSESSMENT: PMHx significant for COPD, DM2, diverticulosis, colostomy. Colostomy was placed 2 months ago 2/2 perforated diverticulitis. The patient is followed at a wound care facility for her open abdominal wound, being treated with a negative pressure wound dressing. 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 Pt was then transferred to [facility] for further evaluation.   Pt reports very poor appetite, no nausea or vomiting. Admitted for NGT decompression however pt is refusing NGT presently. RN currently resuming pt's wound VAC to abdomen.  Pt has lost 11% of her body weight since her surgery in September, this is significant. Pt confirms very poor oral intake, will definitely go entire days without eating or sometimes the only thing that she will eat is a yogurt during the day. Pt has strong opinions about oral nutrition supplements - is afraid to try Ensure or Boost as it may cause  loose output in her colostomy, however really enjoys "Special K chocolate shakes." She was at a rehab facility and was given "thick, orange stuff" that she didn't like (suspect it was Prostat.) Agreeable to trying Resource Breeze once diet order permits.   Pt meets criteria for severe MALNUTRITION in the context of chronic illness as evidenced by intake of <75% x at least 1 month and 11% wt loss x 2 months. Pt is at risk for refeeding syndrome given severe weight loss and dietary recall.   Height: Ht Readings from Last 1 Encounters:  07/28/13 5' 1.5" (1.562 m)    Weight: Wt Readings from Last 1 Encounters:  07/28/13 152 lb (68.947 kg)    Ideal Body Weight: 105 lb  % Ideal Body Weight: 145%  Wt Readings from Last 10 Encounters:  07/28/13 152 lb (68.947 kg)  06/07/13 171 lb 11.8 oz (77.9 kg)  06/07/13 171 lb 11.8 oz (77.9 kg)  06/07/13 171 lb 11.8 oz (77.9 kg)    Usual Body Weight: 171 lb  % Usual Body Weight: 89%  BMI:  Body mass index is 28.26 kg/(m^2). Overweight  Estimated Nutritional Needs: Kcal: 1700 - 2000 Protein: 102 - 120 g Fluid: 1.7 - 2 liters daily  Skin: abdominal wound with VAC  Diet Order:   NPO  EDUCATION NEEDS: -No education needs identified at this time  No intake or output data in the 24 hours ending 07/28/13 1252  Last BM: PTA   Labs:   Recent Labs Lab 07/28/13 0451  NA 145  K 3.5  CL 107  CO2 32  BUN 12  CREATININE 0.58  CALCIUM 9.1  GLUCOSE 118*    CBG (last 3)  No results found for this basename: GLUCAP,  in the last 72 hours  Scheduled Meds: . albuterol  2.5 mg Nebulization QID  . amLODipine  5 mg Oral Daily  . atorvastatin  80 mg Oral q1800  . cycloSPORINE  1 drop Both Eyes BID  . enoxaparin (LOVENOX) injection  40 mg Subcutaneous Q24H  . fluticasone  1 spray Each Nare Daily  . levothyroxine  50 mcg Oral QAC breakfast  . mometasone-formoterol  2 puff Inhalation BID  . pantoprazole (PROTONIX) IV  40 mg Intravenous QHS   . sertraline  200 mg Oral Daily    Continuous Infusions: . dextrose 5 % and 0.45 % NaCl with KCl 20 mEq/L 100 mL/hr at 07/28/13 1914    Past Medical History  Diagnosis Date  . COPD (chronic obstructive pulmonary disease)   . OSA on CPAP   . DM2 (diabetes mellitus, type 2)   . HTN (hypertension)   . Hyperlipidemia   . Depression   . Osteoarthritis   . Diverticulosis   . Anxiety     Past Surgical History  Procedure Laterality Date  . Laparotomy N/A 05/31/2013    Procedure: EXPLORATORY LAPAROTOMY;  Surgeon: Almond Lint, MD;  Location: [facility] OR;  Service: General;  Laterality: N/A;  . Colostomy revision N/A 05/31/2013    Procedure: COLON RESECTION SIGMOID;  Surgeon: Almond Lint, MD;  Location: [facility] OR;  Service: General;  Laterality: N/A;  with End colostomy  . Colostomy revision N/A 06/04/2013    Procedure: COLOSTOMY REVISION;  Surgeon: Shelly Rubenstein, MD;  Location: [facility] OR;  Service: General;  Laterality: N/A;  . Laparotomy  06/04/2013    Procedure: EXPLORATORY LAPAROTOMY;  Surgeon: Shelly Rubenstein, MD;  Location: [facility] OR;  Service: General;;  . Partial colectomy  06/04/2013    Procedure: PARTIAL COLECTOMY;  Surgeon: Shelly Rubenstein, MD;  Location: [facility] OR;  Service: General;;  . Colon surgery      Jarold Motto MS, RD, LDN Pager: [phone] After-hours pager: [phone]

## 2013-07-29 DIAGNOSIS — R110 Nausea: Secondary | ICD-10-CM

## 2013-07-29 LAB — BASIC METABOLIC PANEL
Chloride: 106 mEq/L (ref 96–112)
GFR calc Af Amer: >90 mL/min (ref >90)
GFR calc non Af Amer: >90 mL/min (ref >90)
Potassium: 3.6 mEq/L (ref 3.5–5.1)
Sodium: 141 mEq/L (ref 135–145)

## 2013-07-29 LAB — CBC
HCT: 31.6 % — ABNORMAL LOW (ref 36.0–46.0)
Hemoglobin: 9.6 g/dL — ABNORMAL LOW (ref 12.0–15.0)
MCH: 26.7 pg (ref 26.0–34.0)
RBC: 3.59 MIL/uL — ABNORMAL LOW (ref 3.87–5.11)
WBC: 5.5 10*3/uL (ref 4.0–10.5)

## 2013-07-29 MED ORDER — BOOST / RESOURCE BREEZE PO LIQD
1.0000 | Freq: Three times a day (TID) | ORAL | Status: DC
  Administered 2013-07-29 – 2013-08-01 (×6): 1 via ORAL

## 2013-07-29 MED ORDER — OXYCODONE-ACETAMINOPHEN 5-325 MG PO TABS: 1.0000 | ORAL_TABLET | ORAL | Status: DC | PRN

## 2013-07-29 MED ORDER — PROMETHAZINE HCL 25 MG/ML IJ SOLN
12.5000 mg | Freq: Three times a day (TID) | INTRAMUSCULAR | Status: DC | PRN
  Administered 2013-07-30 – 2013-08-01 (×4): 12.5 mg via INTRAVENOUS
  Filled 2013-07-29 (×5): qty 1

## 2013-07-29 NOTE — Progress Notes (Signed)
<  principal problem not specified> ~1 month s/p partial colectomy with colostomy Subjective: Pt having some nausea and pain, UGI shows stomach is slow to empty but no obstructive process.  Contrast passes to colostomy within 6 hrs  Objective: Vital signs in last 24 hours: Temp:  [97.9 F (36.6 C)-98.6 F (37 C)] 98.4 F (36.9 C) (11/02 1006) Pulse Rate:  [66-82] 82 (11/02 1006) Resp:  [16-18] 18 (11/02 1006) BP: (100-133)/(47-58) 114/48 mmHg (11/02 1006) SpO2:  [94 %-100 %] 94 % (11/02 1006) FiO2 (%):  [28 %] 28 % (11/02 0919) Weight:  [152 lb (68.947 kg)] 152 lb (68.947 kg) (11/01 1100) Last BM Date: 07/28/13  Intake/Output from previous day: 11/01 0701 - 11/02 0700 In: 1400 [I.V.:1400] Out: 1165 [Urine:850; Drains:15; Stool:300] Intake/Output this shift: Total I/O In: -  Out: 300 [Urine:300]  General appearance: alert and cooperative GI: normal findings: soft, non-tender  Lab Results:  Results for orders placed during the hospital encounter of 07/28/13 (from the past 24 hour(s))  GLUCOSE, CAPILLARY     Status: Abnormal   Collection Time    07/28/13  5:40 PM      Result Value Range   Glucose-Capillary 102 (*) 70 - 99 mg/dL  BASIC METABOLIC PANEL     Status: Abnormal   Collection Time    07/29/13  6:04 AM      Result Value Range   Sodium 141  135 - 145 mEq/L   Potassium 3.6  3.5 - 5.1 mEq/L   Chloride 106  96 - 112 mEq/L   CO2 29  19 - 32 mEq/L   Glucose, Bld 129 (*) 70 - 99 mg/dL   BUN 4 (*) 6 - 23 mg/dL   Creatinine, Ser 4.54 (*) 0.50 - 1.10 mg/dL   Calcium 8.7  8.4 - 09.8 mg/dL   GFR calc non Af Amer >90  >90 mL/min   GFR calc Af Amer >90  >90 mL/min  CBC     Status: Abnormal   Collection Time    07/29/13  6:04 AM      Result Value Range   WBC 5.5  4.0 - 10.5 K/uL   RBC 3.59 (*) 3.87 - 5.11 MIL/uL   Hemoglobin 9.6 (*) 12.0 - 15.0 g/dL   HCT 11.9 (*) 14.7 - 82.9 %   MCV 88.0  78.0 - 100.0 fL   MCH 26.7  26.0 - 34.0 pg   MCHC 30.4  30.0 - 36.0 g/dL   RDW  56.2  13.0 - 86.5 %   Platelets 202  150 - 400 K/uL     Studies/Results Radiology     MEDS, Scheduled . albuterol  2.5 mg Nebulization QID  . amLODipine  5 mg Oral Daily  . atorvastatin  80 mg Oral q1800  . cycloSPORINE  1 drop Both Eyes BID  . enoxaparin (LOVENOX) injection  40 mg Subcutaneous Q24H  . fluticasone  1 spray Each Nare Daily  . levothyroxine  50 mcg Oral QAC breakfast  . mometasone-formoterol  2 puff Inhalation BID  . pantoprazole (PROTONIX) IV  40 mg Intravenous QHS  . sertraline  200 mg Oral Daily     Assessment: <principal problem not specified> FTT  Plan: Will start some full liquids Resume home pain meds   LOS: 1 day    Vanita Panda, MD [facility], [place] [phone]   07/29/2013 10:51 AM

## 2013-07-30 DIAGNOSIS — E119 Type 2 diabetes mellitus without complications: Secondary | ICD-10-CM

## 2013-07-30 MED ORDER — METOCLOPRAMIDE HCL 5 MG PO TABS
5.0000 mg | ORAL_TABLET | Freq: Three times a day (TID) | ORAL | Status: DC
  Administered 2013-07-30 (×2): 5 mg via ORAL
  Filled 2013-07-30 (×3): qty 1

## 2013-07-30 NOTE — Care Management Note (Signed)
  Page 1 of 1   07/30/2013     2:46:33 PM   CARE MANAGEMENT NOTE 07/30/2013  Patient:  Carol Spencer, Carol Spencer   Account Number:  [id number]  Date Initiated:  07/30/2013  Documentation initiated by:  Ronny Flurry  Subjective/Objective Assessment:     Action/Plan:   Anticipated DC Date:     Anticipated DC Plan:           Choice offered to / List presented to:             Status of service:   Medicare Important Message given?   (If response is "NO", the following Medicare IM given date fields will be blank) Date Medicare IM given:   Date Additional Medicare IM given:    Discharge Disposition:    Per UR Regulation:    If discussed at Long Length of Stay Meetings, dates discussed:    Comments:   08-01-13 [name] at  [facility] aware patient is discharging to home today . Orders H and P and todays progress note faxed to [phone] . Ronny Flurry RN BSN [phone]    07-30-13 Patient stated her home health is through [facility] [phone],  also all her DME including oxygen .  Spoke with Chip Boer at  [facility] , instructed to call [name] at  [phone] when patinet is ready to discharge .     Ronny Flurry RN BSN [phone]

## 2013-07-30 NOTE — Consult Note (Signed)
WOC wound consult note Reason for Consult: VAC dressing change. Pt from home with home [facility] and now placed to hospital unit at the time of her inpatient admission. [facility] following pt, unclear when last VAC change occurred but seems may have been Friday 07/27/13.   Wound type: surgical wound with 2 openings Measurement: 4 cm x 2.5cm x 3.5cm distal; 0.5cm x 0.5cm x 4.5cm proximal Wound bed: yellow slough in the base of the distal wound, upper wound is clean tracks towards 12 o'clock.  Drainage (amount, consistency, odor) minimal in canister or on dressing Periwound:inact Dressing procedure/placement/frequency: Strip of drape placed between the two open areas, 1pc of black foam placed in proximal wound and 1pc placed in the distal wound. I think the proximal wound would be better drained with the use of white foam, will have the bedside nurse order for next dressing change. Packed upper wound with long pc of black foam, to allow for the foam to bridge down to the distal wound for suction at both sites.  Seal obtained at . Pt tolerated well and was premedicated prior to dressing change. [facility] NP at the bedside for wound assessment.    WOC will follow along for assistance with VAC change as needed.  Jedediah [place] RN,CWOCN [phone]

## 2013-07-30 NOTE — Progress Notes (Signed)
I have seen and examined the patient and agree with the assessment and plans.  Doubt any significant issue with her breast.  Doubt SMA syndrome.  Will continue with current plans.  Janvi Ammar A. Magnus Ivan  MD, FACS

## 2013-07-30 NOTE — Progress Notes (Signed)
Subjective: Pt still complaining of nausea, gastric motility on UGI series, no hx of diabetes according to the patient.  Ostomy with adequate output.  No vomiting.  Tolerating full liquid. C/o right breast pain x2 weeks  Objective: Vital signs in last 24 hours: Temp:  [97.7 F (36.5 C)-99.5 F (37.5 C)] 97.7 F (36.5 C) (11/03 0532) Pulse Rate:  [80-91] 88 (11/03 0532) Resp:  [18] 18 (11/03 0532) BP: (114-145)/(41-62) 123/42 mmHg (11/03 0532) SpO2:  [93 %-100 %] 100 % (11/03 0532) FiO2 (%):  [28 %] 28 % (11/02 1206) Last BM Date: 07/29/13  Intake/Output from previous day: 11/02 0701 - 11/03 0700 In: 1266 [P.O.:400; I.V.:746] Out: 1440 [Urine:1100; Drains:40; Stool:300] Intake/Output this shift:    General appearance: alert, cooperative and no distress Breasts: normal appearance, no masses or tenderness, right side implant-soft, ttp, no erythema or palpable masses GI: soft, non-tender; bowel sounds normal; no masses,  no organomegaly.  Stoma is pink, ostomy bag with stool Incision/Wound:midline wound vac  Lab Results:   Recent Labs  07/28/13 0451 07/29/13 0604  WBC 5.9 5.5  HGB 9.8* 9.6*  HCT 32.2* 31.6*  PLT 235 202   BMET  Recent Labs  07/28/13 0451 07/29/13 0604  NA 145 141  K 3.5 3.6  CL 107 106  CO2 32 29  GLUCOSE 118* 129*  BUN 12 4*  CREATININE 0.58 0.34*  CALCIUM 9.1 8.7   PT/INR No results found for this basename: LABPROT, INR,  in the last 72 hours ABG No results found for this basename: PHART, PCO2, PO2, HCO3,  in the last 72 hours  Studies/Results: Dg Abd Portable 1v  07/28/2013   CLINICAL DATA:  Abdominal pain.  One month status post colostomy.  EXAM: PORTABLE ABDOMEN - 1 VIEW  COMPARISON:  Prior today  FINDINGS: Barium again seen within distal small bowel and colon which are nondilated. Barium has now reached the colostomy site in the left abdomen. There is no evidence of bowel obstruction.  IMPRESSION: Barium within the colon has now  reached the left abdominal colostomy site. No evidence of bowel obstruction.   Electronically Signed   By: Myles Rosenthal M.D.   On: 07/28/2013 14:55   Dg Abd Portable 1v  07/28/2013   CLINICAL DATA:  Abdominal pain.  EXAM: PORTABLE ABDOMEN - 1 VIEW  COMPARISON:  07/28/2013  FINDINGS: Barium from recent upper GI series is now seen in nondilated distal small bowel and proximal colon. Some residual contrast also seen within the urinary bladder. No evidence of dilated bowel loops or bowel obstruction.  IMPRESSION: Barium from recent GI series now seen in distal small bowel and colon, which are nondilated. Nonobstructive bowel gas pattern.   Electronically Signed   By: Myles Rosenthal M.D.   On: 07/28/2013 13:05   Dg Ugi W/small Bowel  07/28/2013   CLINICAL DATA:  Colostomy 1 month ago. Constant abdominal pain. Possible wound infection. 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  EXAM: UPPER GI SERIES  TECHNIQUE: Combined double contrast and single contrast upper GI series using effervescent crystals, thick barium, and thin barium. Subsequently, serial images of the small bowel were obtained including spot views of the terminal ileum.  COMPARISON:  None.  FLUOROSCOPY TIME:  2 min 51 seconds.  FINDINGS: Pharyngeal phase of swallowing was normal. This was only performed on the frontal projection to patient condition. Esophageal spasm was present on every observed swallow. The thoracic esophagus distended appropriately. There are no strictures or filling  defects. Esophageal folds appeared within normal limits.  Residual food was present within the stomach, which mixed with contrast. On positioning the patient in the right-side-down decubitus position, the stomach readily emptied and barium was observed filling duodenal bulb and extending quickly through all portions of the duodenum. There was no compression. Frontal and lateral views were obtained. No evidence of superior mesenteric artery  syndrome. After confirming patency, the study was discontinued without completion of a full small bowel series.  IMPRESSION: 1. 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 No esophageal stricture. 2. Residual food stuffs in the stomach. There was normal emptying of the stomach with decubitus positioning and rapid transit through the duodenum without evidence of intrinsic narrowing or extrinsic compression. 3. These results were called by telephone at the time of interpretation on 07/28/2013 at 10:34 AM to Dr. Corliss Skains, who verbally acknowledged these results.   Electronically Signed   By: Andreas Newport M.D.   On: 07/28/2013 10:39    Anti-infectives: Anti-infectives   None      Assessment/Plan: S/p Hartmans for perforated diverticulitis 05/31/13 Dr. Donell Beers S/p ostomy revision  d/t necrotic ostomy  06/04/13 Dr. Magnus Ivan COPD DMII Hypothyroidism COPD  -start on reglan to help facilitate motility -advance diet -wound vac change today, will assess with WOC -mobilize -Dr. Magnus Ivan to evaluate Rt breast, no evidence of abscess.  She does have a hx of silicone extravasation in 2007.  She is getting regular follow up and reports that she recently had an MRI of the breast.  She is followed by NP in [place]    LOS: 2 days    Carrson Lightcap ANP-BC 07/30/2013 9:13 AM

## 2013-07-31 DIAGNOSIS — J449 Chronic obstructive pulmonary disease, unspecified: Secondary | ICD-10-CM

## 2013-07-31 LAB — GLUCOSE, CAPILLARY: Glucose-Capillary: 102 mg/dL — ABNORMAL HIGH (ref 70–99)

## 2013-07-31 MED ORDER — ALBUTEROL SULFATE (5 MG/ML) 0.5% IN NEBU
2.5000 mg | INHALATION_SOLUTION | Freq: Two times a day (BID) | RESPIRATORY_TRACT | Status: DC
  Administered 2013-07-31 – 2013-08-01 (×2): 2.5 mg via RESPIRATORY_TRACT
  Filled 2013-07-31 (×2): qty 0.5

## 2013-07-31 MED ORDER — METOCLOPRAMIDE HCL 5 MG PO TABS
5.0000 mg | ORAL_TABLET | Freq: Three times a day (TID) | ORAL | Status: DC
  Administered 2013-07-31 – 2013-08-01 (×3): 5 mg via ORAL
  Filled 2013-07-31 (×4): qty 1

## 2013-07-31 MED ORDER — TRAMADOL HCL 50 MG PO TABS
50.0000 mg | ORAL_TABLET | Freq: Three times a day (TID) | ORAL | Status: DC
  Administered 2013-07-31 – 2013-08-01 (×4): 50 mg via ORAL
  Filled 2013-07-31 (×4): qty 1

## 2013-07-31 MED ORDER — PANTOPRAZOLE SODIUM 40 MG PO TBEC
40.0000 mg | DELAYED_RELEASE_TABLET | Freq: Every day | ORAL | Status: DC
  Administered 2013-07-31: 40 mg via ORAL
  Filled 2013-07-31: qty 1

## 2013-07-31 NOTE — Progress Notes (Signed)
Patient stated that she could not sleep with CPAP on last night.  She says the mask hurts her face and the tubing is in her way.  RT made suggestion to patient for someone to bring her home mask and tubing to hospital for tomorrow night.

## 2013-07-31 NOTE — Progress Notes (Signed)
  Subjective: Nausea has improved.  C/o intermittent abdominal pain.  No vomiting.  Ostomy with output.    Objective: Vital signs in last 24 hours: Temp:  [98.3 F (36.8 C)-98.8 F (37.1 C)] 98.3 F (36.8 C) (11/04 1610) Pulse Rate:  [68-88] 88 (11/04 0633) Resp:  [18] 18 (11/04 9604) BP: (124-146)/(68-90) 146/90 mmHg (11/04 5409) SpO2:  [97 %-99 %] 97 % (11/04 0810) Last BM Date: 07/30/13  Intake/Output from previous day: 11/03 0701 - 11/04 0700 In: -  Out: 1050 [Urine:1050] Intake/Output this shift:   PE General appearance: alert, cooperative and no distress  Breasts: normal appearance, no masses or tenderness, right side implant-soft, ttp, no erythema or palpable masses  GI: soft, non-tender; bowel sounds normal; no masses, no organomegaly. Stoma is pink, ostomy bag with stool  Incision/Wound:midline wound vac   Lab Results:   Recent Labs  07/29/13 0604  WBC 5.5  HGB 9.6*  HCT 31.6*  PLT 202   BMET  Recent Labs  07/29/13 0604  NA 141  K 3.6  CL 106  CO2 29  GLUCOSE 129*  BUN 4*  CREATININE 0.34*  CALCIUM 8.7    Anti-infectives: Anti-infectives   None      Assessment/Plan: S/p Hartmans for perforated diverticulitis 05/31/13 Dr. Donell Beers  S/p ostomy revision d/t necrotic ostomy 06/04/13 Dr. Magnus Ivan  COPD  DMII  Hypothyroidism  COPD   i examined her abdominal wound yesterday.  There were 2 openings with yellow slough without an odor, fascia was intact and no evidence of fistula.  Her stoma is pink and viable.  She is tolerating a diet and nausea has improved.  She complains of abdominal pain surrounding her old ostomy site.  This area was soft and without evidence of an abscess, it is healed well.  i have added tramadol to her pain regimen which she can continue for the next 3-4 weeks. We once again discussed her rt breast concerns.  She has a PCP follow up on the 7th, I encouraged her to address this.  At present time she does not appear to have a mass  or an abscess and the implant appears intact.  She may need a repeat diagnostic ultrasound, but it is non urgent. UGI series and repeat XRs were normal, but did note esophageal dysmotility without a stricture.  She is tolerating a diet.  She may benefit from a outpatient GI evaluation. She is stable for discharge.    LOS: 3 days    Keegen Heffern, [facility] 07/31/2013

## 2013-07-31 NOTE — Progress Notes (Signed)
I have seen and examined the patient and agree with the assessment and plans. Hopefully will be ready to go next 24 to 48 hours  Dat Derksen A. Magnus Ivan  MD, FACS

## 2013-08-01 LAB — GLUCOSE, CAPILLARY: Glucose-Capillary: 99 mg/dL (ref 70–99)

## 2013-08-01 MED ORDER — OXYCODONE-ACETAMINOPHEN 5-325 MG PO TABS: 1.0000 | ORAL_TABLET | ORAL | Status: DC | PRN

## 2013-08-01 MED ORDER — METOCLOPRAMIDE HCL 5 MG PO TABS: 5.0000 mg | ORAL_TABLET | Freq: Three times a day (TID) | ORAL | Status: AC

## 2013-08-01 MED ORDER — ONDANSETRON HCL 4 MG PO TABS: 4.0000 mg | ORAL_TABLET | Freq: Three times a day (TID) | ORAL | Status: AC | PRN

## 2013-08-01 MED ORDER — TRAMADOL HCL 50 MG PO TABS: 50.0000 mg | ORAL_TABLET | Freq: Three times a day (TID) | ORAL | Status: DC

## 2013-08-01 NOTE — Consult Note (Addendum)
WOC wound follow up Wound type: midline abdominal wound with intact skin bridge between proximal and distal wound.  Measurement: Distal: 4.0cm x 1.0cm x 4.0cm at 6 oclock; proximal 1.0cm x 1.0cm x 3.0 cm  Wound bed: both appear clean, less yellow slough in the base of the distal wound and the upper wound now that suction applied, it is less soupy Drainage (amount, consistency, odor) minimal in the canister Periwound:intact Dressing procedure/placement/frequency: 1pc of white Versafoam placed in the proximal wound, 1pc of black granufoam placed in the distal wound. Left each of these pc of foam long to allow for them to touch over the draped skin bridge (to protect this intact skin) and apply NPWT to each wound.   Seal obtained at , pt tolerated well. Premedicated just prior to the dressing change.   Plan for dc to home today with resumption of VAC dressings per [facility]. Would recommend white foam for the upper wound.   Lamija Besse Eliberto Ivory RN,CWOCN [phone]   Addendum: Acti-Vac canister to room to attach to her home VAC unit, she did not bring the [facility] unit to plug the home VAC up while she has been inpatient, therefore the battery charge is fairly low. I have placed a new canister on the home VAC and requested that the bedside nurse hook pt up to home VAC just prior to her discharge in hope the charge will last for her travel home. I have instructed the patient to hook up the electrical power cord to the unit as soon as she arrives home to assure the dressing does not loose the seal on the dressing.

## 2013-08-01 NOTE — Progress Notes (Signed)
I have seen and examined the patient and agree with the assessment and plans. Improved so I think she can go home  Jalyn Rosero A. Magnus Ivan  MD, FACS

## 2013-08-01 NOTE — Progress Notes (Signed)
  Subjective: No changes.  Had large amount of stool output last night.  Voiding.  Denies chills, sweats.  Nausea is intermittent, no   Objective: Vital signs in last 24 hours: Temp:  [98 F (36.7 C)-98.3 F (36.8 C)] 98.3 F (36.8 C) (11/05 0452) Pulse Rate:  [67-89] 67 (11/05 0452) Resp:  [18-19] 19 (11/05 0452) BP: (124-133)/(57-89) 124/89 mmHg (11/05 0452) SpO2:  [97 %-98 %] 98 % (11/05 0452) Last BM Date: 07/31/13  Intake/Output from previous day: 11/04 0701 - 11/05 0700 In: 100 [P.O.:100] Out: -  Intake/Output this shift:   PE  General appearance: alert, cooperative and no distress  Breasts: normal appearance, no masses or tenderness, right side implant-soft, ttp, no erythema or palpable masses  GI: soft, ttp left abdomen.  bowel sounds normal; no masses, no organomegaly. Stoma is pink, ostomy bag with stool  Incision/Wound:midline wound vac    Anti-infectives: Anti-infectives   None      Assessment/Plan: S/p Hartmans for perforated diverticulitis 05/31/13 Dr. Donell Beers  S/p ostomy revision d/t necrotic ostomy 06/04/13 Dr. Magnus Ivan  COPD  DMII  Hypothyroidism  COPD   Tolerating PO Ostomy with adequate output Will change VAC and place home machine. Discharge Follow up with wound center Follow up with Dr. Donell Beers    LOS: 4 days    Carol Spencer [facility]  ANP-BC  08/01/2013

## 2013-08-01 NOTE — Discharge Summary (Signed)
Physician Discharge Summary  Carol Spencer FAO:[id number] DOB: July 02, 1946 DOA: 07/28/2013  PCP: Pcp Not In System  Consultation:none  Admit date: 07/28/2013 Discharge date: 08/01/2013  Recommendations for Outpatient Follow-up:   Follow-up Information   Call [facility], [place]. (follow up in 3-4 weeks)    Specialty:  General Surgery   Contact information:   [address] [place] [address] [phone]      Discharge Diagnoses:  1. Nausea, abdominal pain 2. COPD 3. Diabetes Mellitus 4. Hypothyroidism 5. S/p Hartmans for perforated diverticulitis 6. 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  She has been followed by a wound care facility and physician, a CT scan was done for concerns of an abscess and this finding was incidentally found.  She did not complain of abdominal pain, she was tender, no vomiting, but intermittently nauseous and had a poor appetite.  We recommended NGT for decompression, however, she declined.  She had adequate output from ostomy.  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  This was followed by series of abdominal films which were normal and showed adequate progression of contrast through the small bowel, colon and finally her ostomy.  She was started on reglan to help with the nausea with some improvement.  Her wound vac was changed on Monday and again this morning.  Her wound showed yellow sloughing, but no concerns for fistula, tunneling  or infection.  The fascia was intact.  Her vital signs remained stable.  Labs remained stable.  She was started on a diet and advanced.  She complained of right breast tenderness of 3 weeks.  There was no evidence of an abscess.  She is scheduled to see her PCP on the 7th.  I would recommend further evaluation with a diagnostic mammogram.  With the esophageal dysmotility, i would recommend GI evaluation or endoscopy.  The patient did not have SMA syndrome and is very atypical given her body habitus.  On HD #4 her pain was well controlled with oral medication, VSS, ambulating, wound vac was changed.  She was therefore felt stable for discharge.  She was once again set up for home health for vac changes.  She was advised to follow up with wound center.  She was advised to follow up with Dr. Donell Beers in 3-4 weeks.  We discussed warning signs that warrant immediate attention.    Discharge Instructions     Medication List         albuterol (5 MG/ML) 0.5% nebulizer solution  Commonly known as:  PROVENTIL  Take 0.5 mLs (2.5 mg total) by nebulization 4 (four) times daily.     amLODipine 5 MG tablet  Commonly known as:  NORVASC  Take 5 mg by mouth daily.     cycloSPORINE 0.05 % ophthalmic emulsion  Commonly known as:  RESTASIS  Place 1 drop into both eyes 2 (two) times daily.     fluticasone 50 MCG/ACT nasal spray  Commonly known as:  FLONASE  Place 1 spray into the nose daily.     Fluticasone-Salmeterol 500-50 MCG/DOSE Aepb  Commonly known as:  ADVAIR  Inhale 1 puff into the lungs every 12 (twelve) hours.     levothyroxine 50 MCG tablet  Commonly known as:  SYNTHROID, LEVOTHROID  Take 50 mcg by mouth daily before breakfast.     metoCLOPramide 5 MG tablet  Commonly known as:  REGLAN  Take 1 tablet (5 mg total) by mouth 4 (four) times daily - after meals and at bedtime.     ondansetron 4 MG tablet  Commonly known as:  ZOFRAN  Take 1 tablet (4 mg total) by mouth every 8 (eight) hours as  needed for nausea or vomiting.     oxyCODONE-acetaminophen 5-325 MG per tablet  Commonly known as:  PERCOCET/ROXICET  Take 1-2 tablets by mouth every 4 (four) hours as needed for severe pain.     rosuvastatin 40 MG tablet  Commonly known as:  CRESTOR  Take 40 mg by mouth at bedtime.     sertraline 100 MG tablet  Commonly known as:  ZOLOFT  Take 200 mg by mouth daily.     traMADol 50 MG tablet  Commonly known as:  ULTRAM  Take 1 tablet (50 mg total) by mouth 4 (four) times daily - after meals and at bedtime.           Follow-up Information   Call [facility], [place]. (follow up in 3-4 weeks)    Specialty:  General Surgery   Contact information:   [address] [place] [address] [phone]        The results of significant diagnostics from this hospitalization (including imaging, microbiology, ancillary and laboratory) are listed below for reference.    Significant Diagnostic Studies: Dg Abd Acute W/chest  07/28/2013   CLINICAL DATA:  Recent colonic resection and colostomy for diverticulitis. Subsequent evaluation.  EXAM: ACUTE ABDOMEN SERIES (ABDOMEN 2 VIEW & CHEST 1 VIEW)  COMPARISON:  No prior abdominal imaging. Portable chest x-rays 06/05/2013 dating back to 06/01/2013.  FINDINGS: Bowel gas pattern unremarkable without evidence of obstruction or significant ileus. No evidence of free air or significant air-fluid levels on the erect image. Oral contrast material throughout decompressed colon, including in the ostomy bag. Surgical wound vac device on the right side of the abdomen. No evidence of free intraperitoneal air on the erect image. Surgical clips in the right upper quadrant from prior cholecystectomy. Prior L4 through S1 fusion. Generalized osseous demineralization.  Cardiac silhouette mildly to moderately enlarged but stable. Thoracic aorta atherosclerotic, unchanged. Hilar and mediastinal contours otherwise unremarkable. Stable chronic blunting of the  left costophrenic angle. No new pulmonary parenchymal abnormalities.  IMPRESSION: 1. No acute abdominal abnormality. 2. Stable chronic pleuroparenchymal scarring at the left lung base. No acute cardiopulmonary disease. Stable cardiomegaly.   Electronically Signed   By: Hulan Saas M.D.   On: 07/28/2013 07:09   Dg Abd Portable 1v  07/28/2013   CLINICAL DATA:  Abdominal pain.  One month status post colostomy.  EXAM: PORTABLE ABDOMEN - 1 VIEW  COMPARISON:  Prior today  FINDINGS: Barium again seen within distal small bowel and colon which are nondilated. Barium has now reached the colostomy site in the left abdomen. There is no evidence of bowel obstruction.  IMPRESSION: Barium within the colon has now reached the left abdominal colostomy site. No evidence of bowel obstruction.   Electronically Signed   By: Alver Sorrow.D.  On: 07/28/2013 14:55   Dg Abd Portable 1v  07/28/2013   CLINICAL DATA:  Abdominal pain.  EXAM: PORTABLE ABDOMEN - 1 VIEW  COMPARISON:  07/28/2013  FINDINGS: Barium from recent upper GI series is now seen in nondilated distal small bowel and proximal colon. Some residual contrast also seen within the urinary bladder. No evidence of dilated bowel loops or bowel obstruction.  IMPRESSION: Barium from recent GI series now seen in distal small bowel and colon, which are nondilated. Nonobstructive bowel gas pattern.   Electronically Signed   By: Myles Rosenthal M.D.   On: 07/28/2013 13:05   Dg Ugi W/small Bowel  07/28/2013   CLINICAL DATA:  Colostomy 1 month ago. Constant abdominal pain. Possible wound infection. 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  EXAM: UPPER GI SERIES  TECHNIQUE: Combined double contrast and single contrast upper GI series using effervescent crystals, thick barium, and thin barium. Subsequently, serial images of the small bowel were obtained including spot views of the terminal ileum.  COMPARISON:  None.  FLUOROSCOPY TIME:  2 min 51  seconds.  FINDINGS: Pharyngeal phase of swallowing was normal. This was only performed on the frontal projection to patient condition. Esophageal spasm was present on every observed swallow. The thoracic esophagus distended appropriately. There are no strictures or filling defects. Esophageal folds appeared within normal limits.  Residual food was present within the stomach, which mixed with contrast. On positioning the patient in the right-side-down decubitus position, the stomach readily emptied and barium was observed filling duodenal bulb and extending quickly through all portions of the duodenum. There was no compression. Frontal and lateral views were obtained. No evidence of superior mesenteric artery syndrome. After confirming patency, the study was discontinued without completion of a full small bowel series.  IMPRESSION: 1. 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 No esophageal stricture. 2. Residual food stuffs in the stomach. There was normal emptying of the stomach with decubitus positioning and rapid transit through the duodenum without evidence of intrinsic narrowing or extrinsic compression. 3. These results were called by telephone at the time of interpretation on 07/28/2013 at 10:34 AM to Dr. Corliss Skains, who verbally acknowledged these results.   Electronically Signed   By: Andreas Newport M.D.   On: 07/28/2013 10:39    Microbiology: No results found for this or any previous visit (from the past 240 hour(s)).   Labs: Basic Metabolic Panel:  Recent Labs Lab 07/28/13 0451 07/29/13 0604  NA 145 141  K 3.5 3.6  CL 107 106  CO2 32 29  GLUCOSE 118* 129*  BUN 12 4*  CREATININE 0.58 0.34*  CALCIUM 9.1 8.7   Liver Function Tests: No results found for this basename: AST, ALT, ALKPHOS, BILITOT, PROT, ALBUMIN,  in the last 168 hours No results found for this basename: LIPASE, AMYLASE,  in the last 168 hours No results found for this basename: AMMONIA,  in the last  168 hours CBC:  Recent Labs Lab 07/28/13 0451 07/29/13 0604  WBC 5.9 5.5  NEUTROABS 2.6  --   HGB 9.8* 9.6*  HCT 32.2* 31.6*  MCV 88.5 88.0  PLT 235 202   CBG:  Recent Labs Lab 07/28/13 1740 07/31/13 0748 08/01/13 0734  GLUCAP 102* 102* 99    Active Problems:   * No active hospital problems. *   Time coordinating discharge: <30 mins  Signed:  Ruther Ephraim, ANP-BC

## 2013-08-01 NOTE — Progress Notes (Signed)
Discharge instructions gone over with patient. Home medications gone over. Prescription given. Follow up appointment to be made. [facility] following patient for vac dressing changes. Home vac on patient when discharge. Patient verbalized understanding of instructions. Patient wearing home o2 at discharge.

## 2013-09-18 ENCOUNTER — Telehealth (INDEPENDENT_AMBULATORY_CARE_PROVIDER_SITE_OTHER): Payer: Self-pay

## 2013-09-18 NOTE — Telephone Encounter (Signed)
Patient states she is having small amt of slimy bms with nausea , denies temp . Advised to take her zofran 4mg  as directed and start a bland diet. She will call if her condition changes . She is going to the wound clinic today  and she will inform them also

## 2013-09-25 ENCOUNTER — Telehealth (INDEPENDENT_AMBULATORY_CARE_PROVIDER_SITE_OTHER): Payer: Self-pay | Admitting: General Surgery

## 2013-09-25 NOTE — Telephone Encounter (Signed)
Pt called back immediately to report that her hair is falling out "by the handful."  She is noting large amounts on her pillow, in her Algis Liming and in the shower drain.  She will discuss this at her appt.

## 2013-09-25 NOTE — Telephone Encounter (Signed)
Pt called and asked if we will call in Rx for PHENERGAN, as the Zofran is not helping her nausea.  She is still battling with bowel problems (some diarrhea and lots of mucous.)  Noted she had appt next week with Dr. Magnus Ivan, so will ask if he will call in some for her, but he hasn't seen her in awhile.  Verified to use pharmacy in Epic.

## 2013-09-26 ENCOUNTER — Telehealth (INDEPENDENT_AMBULATORY_CARE_PROVIDER_SITE_OTHER): Payer: Self-pay | Admitting: General Surgery

## 2013-09-26 NOTE — Telephone Encounter (Signed)
Pt calling with nausea and increased liquid in her ostomy bag.  She is having some cramping.  I recommended that she be evaluated in her local ED or [facility] for dehydration or C diff.

## 2013-10-03 ENCOUNTER — Encounter (INDEPENDENT_AMBULATORY_CARE_PROVIDER_SITE_OTHER): Payer: Medicare Other | Admitting: Surgery

## 2013-10-08 ENCOUNTER — Telehealth (INDEPENDENT_AMBULATORY_CARE_PROVIDER_SITE_OTHER): Payer: Self-pay | Admitting: *Deleted

## 2013-10-08 NOTE — Telephone Encounter (Signed)
Patient called to report that she continues to have issues with nausea and diarrhea.  Patient states that the diarrhea comes and goes ever since surgery.  Patient states she doesn't know what the nausea is coming from but thinks it is a possibility that it is due to surgery.  Patient asking for prescription for Phenergan for the nausea.  Patient states that she has tried Zofran but that does not work for her.  Explained that I would send a message to Dr. Magnus IvanBlackman to review symptoms to determine the best POC.  Patient has another appt to see Dr. Magnus IvanBlackman on 11/02/13.

## 2013-10-08 NOTE — Telephone Encounter (Signed)
Her nausea is multifactorial.  She has a colostomy. She needs to see her primary care physician  Dr. Donell BeersByerly actually did her original surgery, I just revised her ostomy

## 2013-10-09 NOTE — Telephone Encounter (Signed)
Attempted to call patient back 3 times with the phone ringing with a busy signal each time.  If patient calls back she needs to below message from Dr. Magnus IvanBlackman.

## 2013-11-02 ENCOUNTER — Encounter (INDEPENDENT_AMBULATORY_CARE_PROVIDER_SITE_OTHER): Payer: Self-pay | Admitting: Surgery

## 2013-11-02 ENCOUNTER — Ambulatory Visit (INDEPENDENT_AMBULATORY_CARE_PROVIDER_SITE_OTHER): Payer: Medicare Other | Admitting: Surgery

## 2013-11-02 VITALS — BP 130/82 | HR 84 | Temp 98.3°F | Resp 15 | Ht 61.5 in | Wt 153.8 lb

## 2013-11-02 DIAGNOSIS — Z933 Colostomy status: Secondary | ICD-10-CM

## 2013-11-02 NOTE — Progress Notes (Signed)
Subjective:     Patient ID: Carol Spencer, female   DOB: Jul 18, 1946, 68 y.o.   MRN: [id number]  HPI This is a patient who had emergent surgery for perforated colon back in September by Dr. Donell BeersByerly. Her ostomy became necrotic and I had to revise this shortly thereafter. She now presents for colostomy takedown. She is on chronic home oxygen. She has a history of COPD and has had pneumonia as well.  Review of Systems     Objective:   Physical Exam On exam, she actually looks well. Her abdomen is soft and nontender and her lungs are clear. She is currently on oxygen    Assessment:     Colostomy in place     Plan:     I explained to her that we will have to have pulmonary clearance to see if she is a candidate to have her colostomy reversed. She did handle to emergent surgeries with intubation and was able to come off the ventilator but at times so I suspect we will be able to achieve clearance but nonetheless, our anesthesiologist will want a preoperative pulmonary evaluation prior to general anesthesia. We will try to arrange this with her pulmonologist in [name].

## 2013-11-05 ENCOUNTER — Encounter (INDEPENDENT_AMBULATORY_CARE_PROVIDER_SITE_OTHER): Payer: Self-pay | Admitting: General Surgery

## 2013-11-05 ENCOUNTER — Telehealth (INDEPENDENT_AMBULATORY_CARE_PROVIDER_SITE_OTHER): Payer: Self-pay | Admitting: General Surgery

## 2013-11-05 NOTE — Telephone Encounter (Signed)
Called patient to let her know that she has a apt to see Dr Andres Labrumave Smith in [facility] for medical clearance and her apt is on 11-07-2013 @ 9:45. Patient is aware of the apt

## 2013-11-05 NOTE — Telephone Encounter (Signed)
Dr Andres Labrumave Smith office number is [phone] and fax number is [phone]

## 2013-11-27 ENCOUNTER — Telehealth (INDEPENDENT_AMBULATORY_CARE_PROVIDER_SITE_OTHER): Payer: Self-pay | Admitting: General Surgery

## 2013-11-27 ENCOUNTER — Encounter (INDEPENDENT_AMBULATORY_CARE_PROVIDER_SITE_OTHER): Payer: Self-pay | Admitting: Surgery

## 2013-11-27 NOTE — Telephone Encounter (Signed)
LMOM for Carol Spencer to call back and ask for Carol Spencer, I need to get her an apt to see Dr Magnus IvanBlackman. We have received medical clearance for surgery, and per Dr Magnus IvanBlackman he wants Carol Spencer to be seen again so he can talk to her about the surgery and she can get a bowel prep in the office

## 2013-11-28 ENCOUNTER — Encounter (INDEPENDENT_AMBULATORY_CARE_PROVIDER_SITE_OTHER): Payer: Self-pay

## 2013-12-14 ENCOUNTER — Encounter (INDEPENDENT_AMBULATORY_CARE_PROVIDER_SITE_OTHER): Payer: Self-pay | Admitting: Surgery

## 2013-12-14 ENCOUNTER — Encounter (INDEPENDENT_AMBULATORY_CARE_PROVIDER_SITE_OTHER): Payer: Self-pay

## 2013-12-14 ENCOUNTER — Ambulatory Visit (INDEPENDENT_AMBULATORY_CARE_PROVIDER_SITE_OTHER): Payer: Medicare Other | Admitting: Surgery

## 2013-12-14 VITALS — BP 130/72 | HR 78 | Temp 97.5°F | Resp 16 | Ht 61.0 in | Wt 155.0 lb

## 2013-12-14 DIAGNOSIS — Z933 Colostomy status: Secondary | ICD-10-CM

## 2013-12-14 NOTE — Progress Notes (Signed)
Subjective:     Patient ID: Carol FinchCarolyn Spencer, female   DOB: 1945-12-07, 68 y.o.   MRN: [id number]  HPI She is here today to discuss colostomy reversal. She has now seen her pulmonologist at [place]. She remains at a moderate pulmonary risk for surgery. She is adamant about having her colostomy reversed  Review of Systems     Objective:   Physical Exam  on exam, her colostomy is patent and well perfused and her midline incision is well-healed    Assessment:     Colostomy in place     Plan:     I will now schedule her for colostomy takedown with colon reanastomosis. I discussed the risks with her in detail. She does understand she is at significant pulmonary risk and still wishes to proceed. Surgery will be scheduled

## 2014-01-13 ENCOUNTER — Other Ambulatory Visit (INDEPENDENT_AMBULATORY_CARE_PROVIDER_SITE_OTHER): Payer: Self-pay | Admitting: General Surgery

## 2014-02-15 ENCOUNTER — Telehealth (INDEPENDENT_AMBULATORY_CARE_PROVIDER_SITE_OTHER): Payer: Self-pay

## 2014-02-15 NOTE — Telephone Encounter (Signed)
Patient states she needs a RX for colostomy bags, she has medicare . Spoke to [employer] in [place].# [id number]  the  Pharmacist  will send  A medical necessity form to be completed by DR. Magnus Ivan for patient to have colostomy supplies paid by medicare. Advised for form to be directed  to [place] .

## 2014-07-17 IMAGING — CR DG ABDOMEN ACUTE W/ 1V CHEST
3 series · 3 of 3 positions shown · non-contrast
Comparison: No prior abdominal imaging. Portable chest x-rays
06/05/2013 dating back to 06/01/2013.

CLINICAL DATA: Recent colonic resection and colostomy for
diverticulitis. Subsequent evaluation.

EXAM:
ACUTE ABDOMEN SERIES (ABDOMEN 2 VIEW & CHEST 1 VIEW)

[w chest pa]
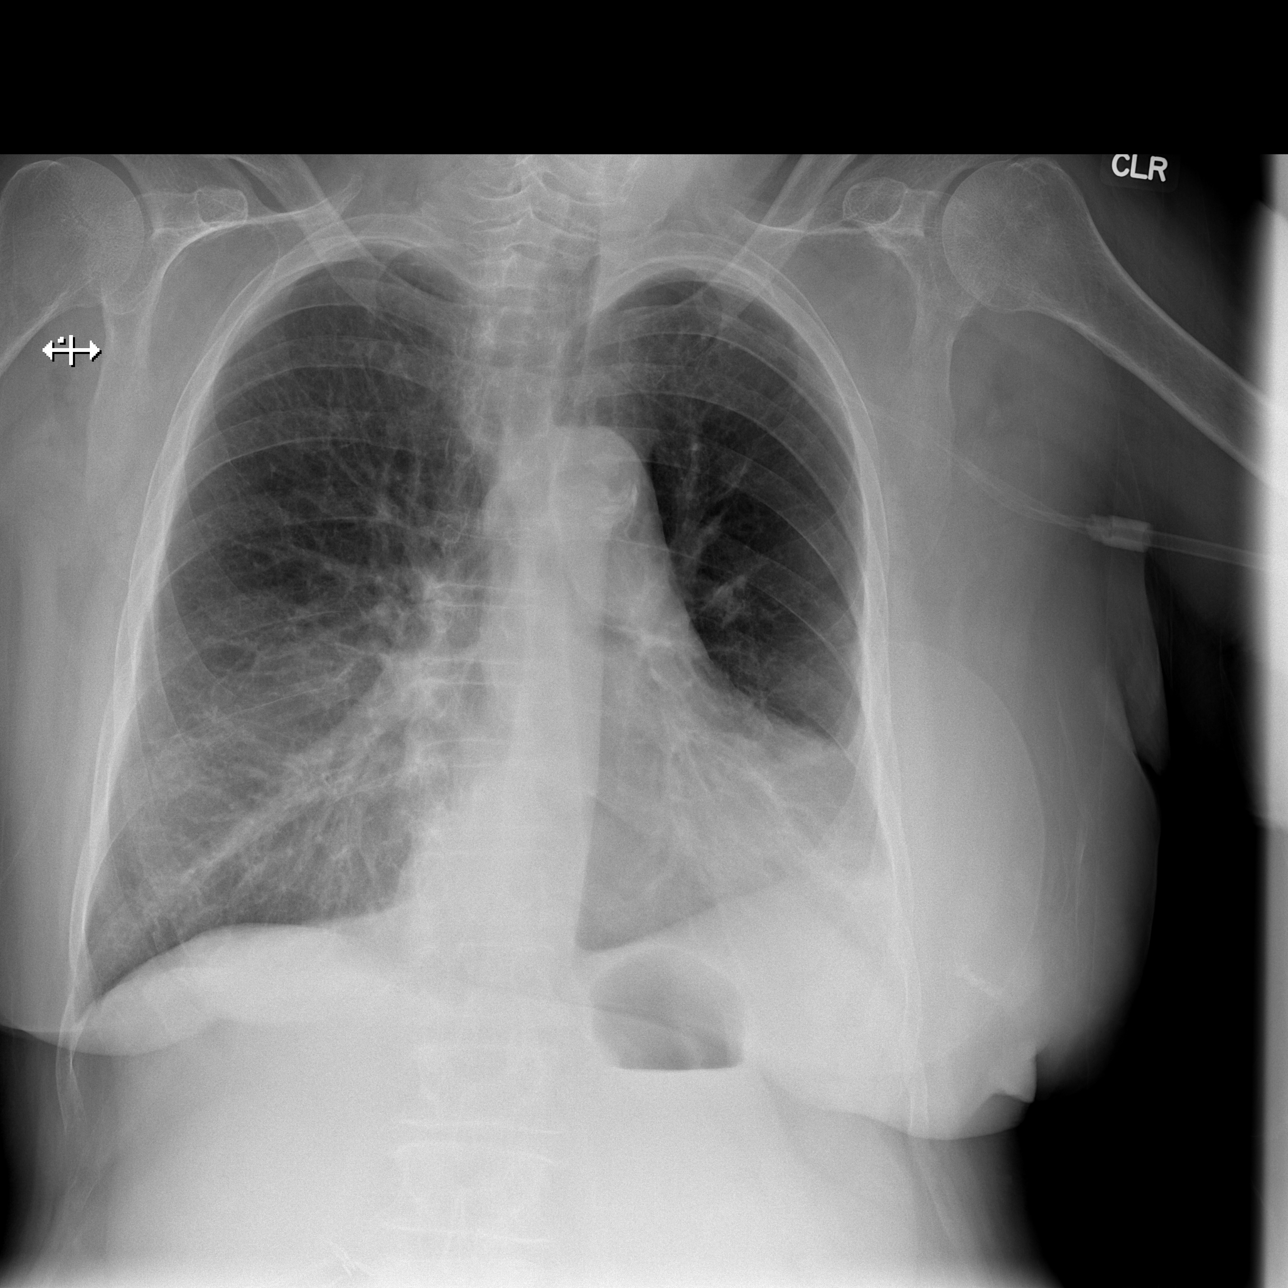

[w abdomen upright]
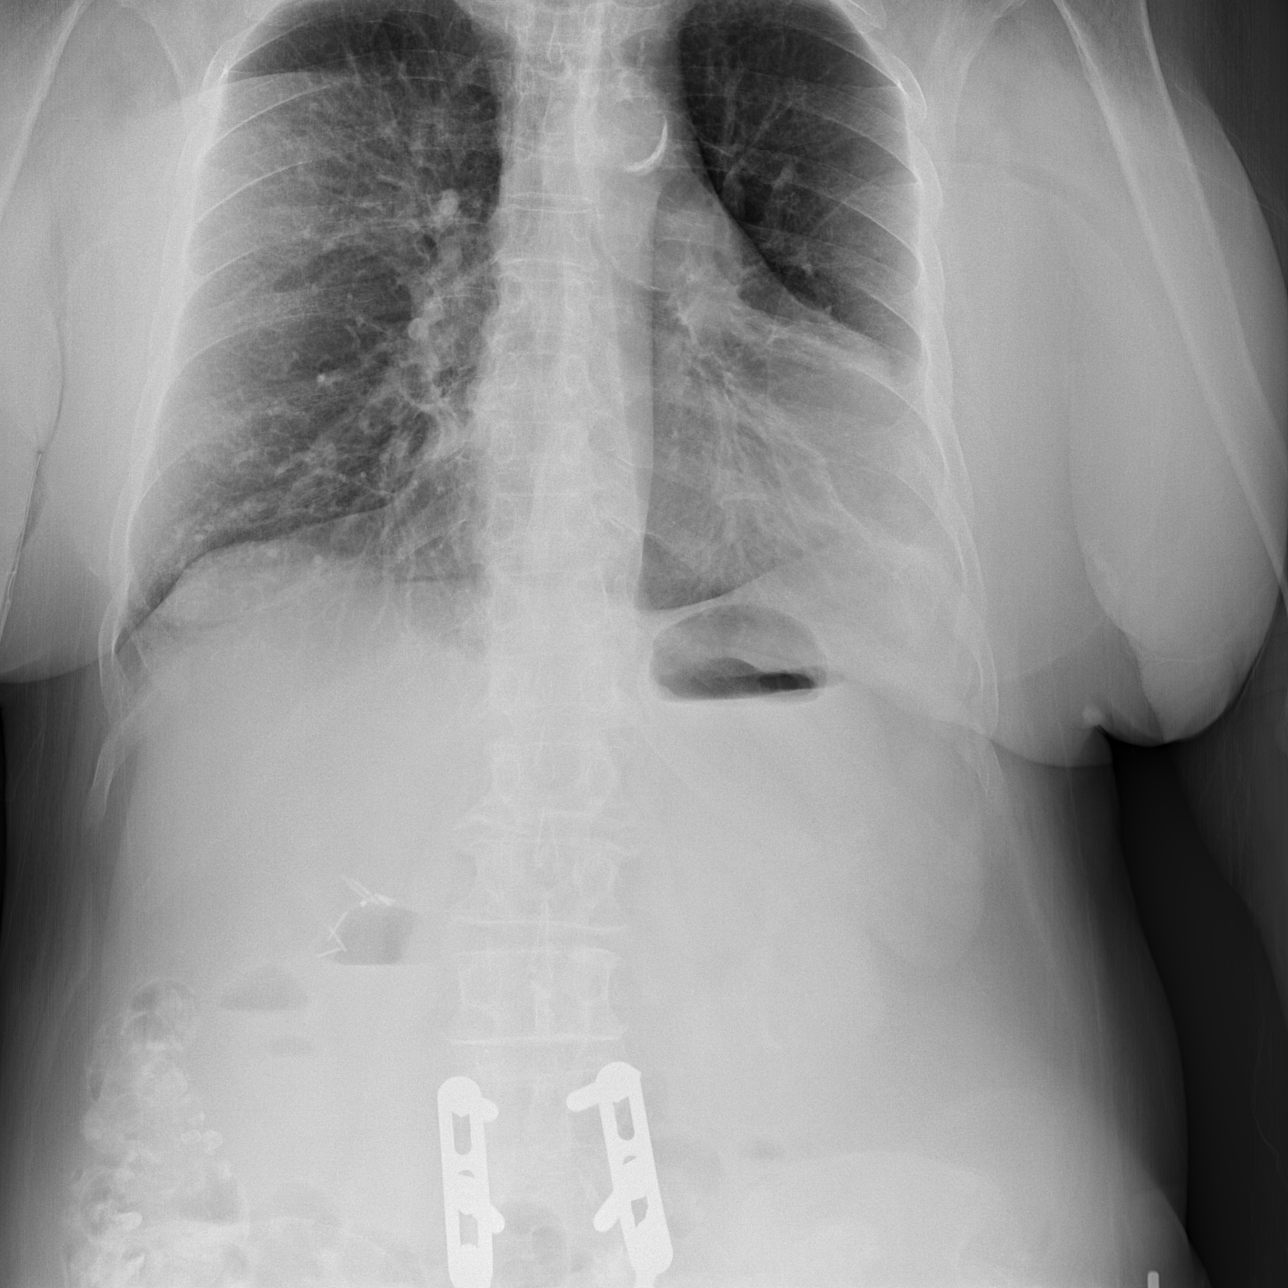

[t abdomen supine]
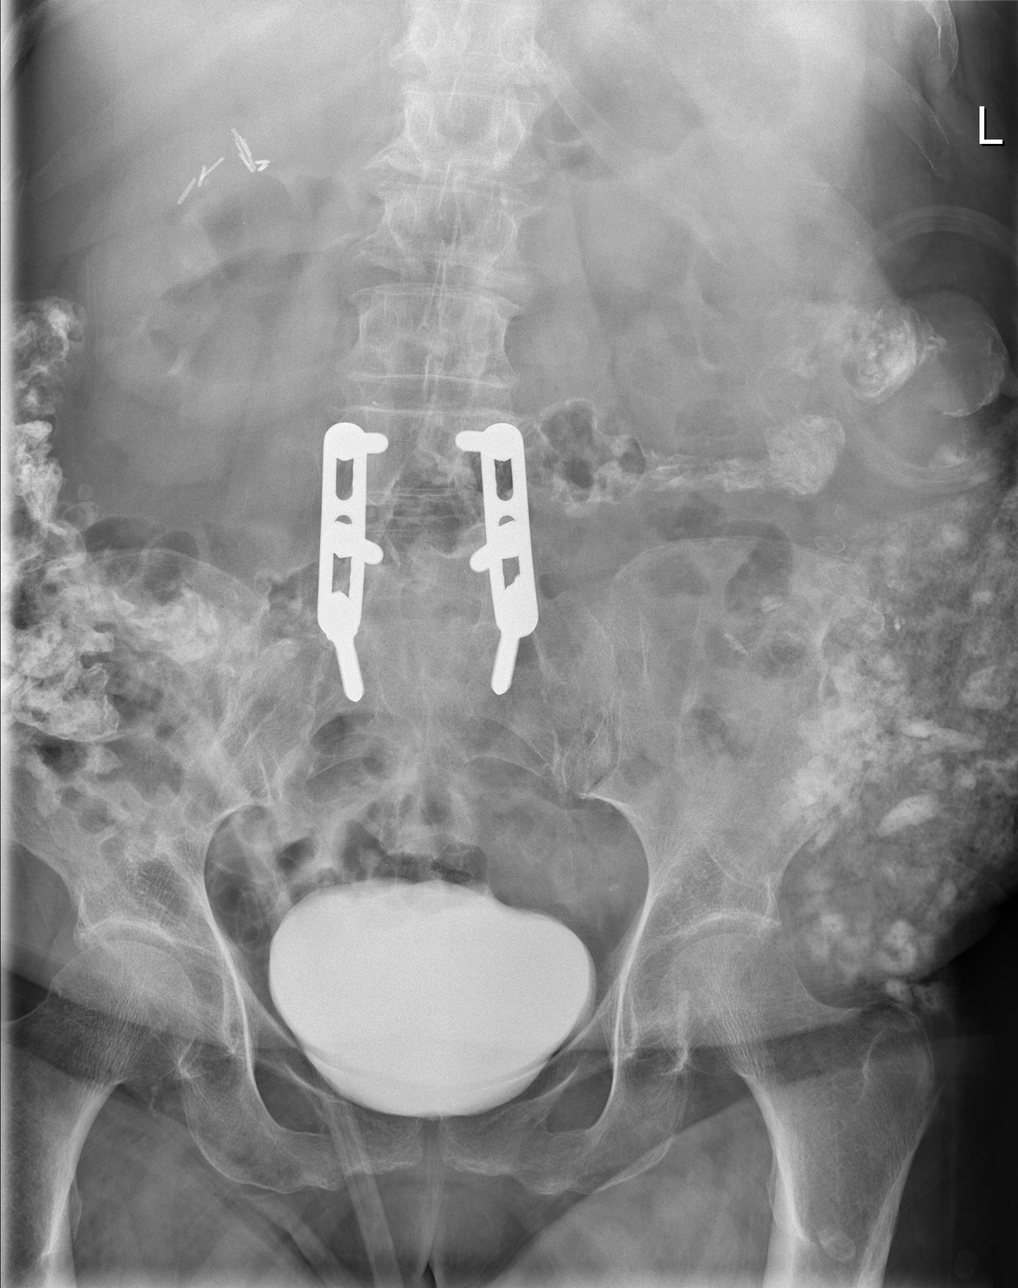

[3 of 3 positions shown; findings below may reference images not displayed]

FINDINGS: Bowel gas pattern unremarkable without evidence of obstruction or
significant ileus. No evidence of free air or significant air-fluid
levels on the erect image. Oral contrast material throughout
decompressed colon, including in the ostomy bag. Surgical wound vac
device on the right side of the abdomen. No evidence of free
intraperitoneal air on the erect image. Surgical clips in the right
upper quadrant from prior cholecystectomy. Prior L4 through S1
fusion. Generalized osseous demineralization.

Cardiac silhouette mildly to moderately enlarged but stable.
Thoracic aorta atherosclerotic, unchanged. Hilar and mediastinal
contours otherwise unremarkable. Stable chronic blunting of the left
costophrenic angle. No new pulmonary parenchymal abnormalities.
IMPRESSION: 1. No acute abdominal abnormality.
2. Stable chronic pleuroparenchymal scarring at the left lung base.
No acute cardiopulmonary disease. Stable cardiomegaly.

## 2019-01-26 DEATH — deceased
